# Patient Record
Sex: Female | Born: 1990 | Hispanic: No | Marital: Married | Smoking: Current every day smoker
Health system: Southern US, Community
[De-identification: ages and names within clinical notes are randomized; demographics above are authoritative.]

## PROBLEM LIST (undated history)

## (undated) DIAGNOSIS — Z789 Other specified health status: Secondary | ICD-10-CM

## (undated) DIAGNOSIS — Z720 Tobacco use: Secondary | ICD-10-CM

## (undated) DIAGNOSIS — Z8481 Family history of carrier of genetic disease: Secondary | ICD-10-CM

## (undated) HISTORY — PX: NO PAST SURGERIES: SHX2092

## (undated) HISTORY — DX: Family history of carrier of genetic disease: Z84.81

## (undated) HISTORY — DX: Other specified health status: Z78.9

---

## 2012-08-10 ENCOUNTER — Observation Stay: Payer: Self-pay

## 2012-08-10 LAB — PIH PROFILE
Calcium, Total: 9 mg/dL (ref 8.5–10.1)
Co2: 23 mmol/L (ref 21–32)
Creatinine: 0.51 mg/dL — ABNORMAL LOW (ref 0.60–1.30)
EGFR (Non-African Amer.): >60
HCT: 35.9 % (ref 35.0–47.0)
HGB: 12.6 g/dL (ref 12.0–16.0)
MCHC: 35.1 g/dL (ref 32.0–36.0)
Osmolality: 269 (ref 275–301)
Platelet: 298 10*3/uL (ref 150–440)
Potassium: 3.9 mmol/L (ref 3.5–5.1)
RBC: 4.33 10*6/uL (ref 3.80–5.20)
SGOT(AST): 22 U/L (ref 15–37)
Sodium: 136 mmol/L (ref 136–145)
Uric Acid: 4.2 mg/dL (ref 2.6–6.0)
WBC: 17.2 10*3/uL — ABNORMAL HIGH (ref 3.6–11.0)

## 2012-08-10 LAB — PROTEIN / CREATININE RATIO, URINE

## 2012-08-29 ENCOUNTER — Inpatient Hospital Stay: Payer: Self-pay

## 2012-08-29 LAB — DRUG SCREEN, URINE
Barbiturates, Ur Screen: NEGATIVE (ref ?–200)
Benzodiazepine, Ur Scrn: NEGATIVE (ref ?–200)
Cocaine Metabolite,Ur ~~LOC~~: NEGATIVE (ref ?–300)
Methadone, Ur Screen: NEGATIVE (ref ?–300)
Opiate, Ur Screen: NEGATIVE (ref ?–300)
Phencyclidine (PCP) Ur S: NEGATIVE (ref ?–25)
Tricyclic, Ur Screen: NEGATIVE (ref ?–1000)

## 2012-08-29 LAB — CBC WITH DIFFERENTIAL/PLATELET
Basophil %: 0.5 %
HCT: 39.2 % (ref 35.0–47.0)
HGB: 13.6 g/dL (ref 12.0–16.0)
Lymphocyte %: 17.1 %
MCH: 29.3 pg (ref 26.0–34.0)
MCV: 85 fL (ref 80–100)
Platelet: 311 10*3/uL (ref 150–440)
RBC: 4.64 10*6/uL (ref 3.80–5.20)
RDW: 14.3 % (ref 11.5–14.5)

## 2012-08-29 LAB — GC/CHLAMYDIA PROBE AMP

## 2012-08-30 LAB — HEMATOCRIT: HCT: 34 % — ABNORMAL LOW (ref 35.0–47.0)

## 2013-04-22 ENCOUNTER — Emergency Department: Payer: Self-pay | Admitting: Emergency Medicine

## 2014-01-08 ENCOUNTER — Emergency Department: Payer: Self-pay | Admitting: Student

## 2014-01-08 LAB — CBC
HCT: 41.1 % (ref 35.0–47.0)
HGB: 13.1 g/dL (ref 12.0–16.0)
MCH: 28.1 pg (ref 26.0–34.0)
MCHC: 31.8 g/dL — ABNORMAL LOW (ref 32.0–36.0)
MCV: 89 fL (ref 80–100)
Platelet: 262 10*3/uL (ref 150–440)
RBC: 4.65 10*6/uL (ref 3.80–5.20)
RDW: 14.1 % (ref 11.5–14.5)
WBC: 15.3 10*3/uL — ABNORMAL HIGH (ref 3.6–11.0)

## 2014-01-08 LAB — COMPREHENSIVE METABOLIC PANEL
ALBUMIN: 3.3 g/dL — AB (ref 3.4–5.0)
Alkaline Phosphatase: 62 U/L
Anion Gap: 9 (ref 7–16)
BILIRUBIN TOTAL: 0.2 mg/dL (ref 0.2–1.0)
BUN: 7 mg/dL (ref 7–18)
CALCIUM: 8.6 mg/dL (ref 8.5–10.1)
Chloride: 105 mmol/L (ref 98–107)
Co2: 25 mmol/L (ref 21–32)
Creatinine: 0.46 mg/dL — ABNORMAL LOW (ref 0.60–1.30)
EGFR (African American): >60
EGFR (Non-African Amer.): >60
Glucose: 111 mg/dL — ABNORMAL HIGH (ref 65–99)
OSMOLALITY: 276 (ref 275–301)
POTASSIUM: 3.8 mmol/L (ref 3.5–5.1)
SGOT(AST): 13 U/L — ABNORMAL LOW (ref 15–37)
SGPT (ALT): 11 U/L — ABNORMAL LOW
SODIUM: 139 mmol/L (ref 136–145)
Total Protein: 7.3 g/dL (ref 6.4–8.2)

## 2014-01-08 LAB — URINALYSIS, COMPLETE
Bacteria: NONE SEEN
Bilirubin,UR: NEGATIVE
Blood: NEGATIVE
Glucose,UR: NEGATIVE mg/dL (ref 0–75)
Ketone: NEGATIVE
LEUKOCYTE ESTERASE: NEGATIVE
Nitrite: NEGATIVE
PH: 5 (ref 4.5–8.0)
PROTEIN: NEGATIVE
RBC,UR: 1 /HPF (ref 0–5)
Specific Gravity: 1.032 (ref 1.003–1.030)
Squamous Epithelial: 4

## 2014-01-08 LAB — WET PREP, GENITAL

## 2014-01-08 LAB — HCG, QUANTITATIVE, PREGNANCY: BETA HCG, QUANT.: 20845 m[IU]/mL — AB

## 2014-01-09 LAB — GC/CHLAMYDIA PROBE AMP

## 2014-06-24 ENCOUNTER — Observation Stay
Admit: 2014-06-24 | Disposition: A | Discharge status: Home / Self Care | Payer: Self-pay | Attending: Obstetrics and Gynecology | Admitting: Obstetrics and Gynecology

## 2014-06-24 ENCOUNTER — Inpatient Hospital Stay
Admit: 2014-06-24 | Disposition: A | Discharge status: Home / Self Care | Payer: Self-pay | Attending: Advanced Practice Midwife | Admitting: Advanced Practice Midwife

## 2014-06-24 LAB — URINALYSIS, COMPLETE
Bacteria: NONE SEEN
Bilirubin,UR: NEGATIVE
Blood: NEGATIVE
Glucose,UR: NEGATIVE mg/dL (ref 0–75)
Ketone: NEGATIVE
NITRITE: NEGATIVE
Ph: 6 (ref 4.5–8.0)
Protein: NEGATIVE
RBC,UR: 2 /HPF (ref 0–5)
SPECIFIC GRAVITY: 1.032 (ref 1.003–1.030)
Squamous Epithelial: 4

## 2014-06-24 LAB — CBC WITH DIFFERENTIAL/PLATELET
BASOS ABS: 0.1 10*3/uL (ref 0.0–0.1)
BASOS ABS: 0.1 10*3/uL (ref 0.0–0.1)
BASOS PCT: 0.4 %
Basophil %: 0.4 %
EOS ABS: 0.2 10*3/uL (ref 0.0–0.7)
EOS PCT: 1.1 %
Eosinophil #: 0.1 10*3/uL (ref 0.0–0.7)
Eosinophil %: 0.3 %
HCT: 37.7 % (ref 35.0–47.0)
HCT: 40.4 % (ref 35.0–47.0)
HGB: 12.6 g/dL (ref 12.0–16.0)
HGB: 13.2 g/dL (ref 12.0–16.0)
LYMPHS ABS: 2.3 10*3/uL (ref 1.0–3.6)
LYMPHS PCT: 23.3 %
Lymphocyte #: 4 10*3/uL — ABNORMAL HIGH (ref 1.0–3.6)
Lymphocyte %: 11.3 %
MCH: 28.9 pg (ref 26.0–34.0)
MCH: 28.5 pg (ref 26.0–34.0)
MCHC: 33.4 g/dL (ref 32.0–36.0)
MCHC: 32.7 g/dL (ref 32.0–36.0)
MCV: 87 fL (ref 80–100)
MCV: 87 fL (ref 80–100)
MONOS PCT: 6.2 %
Monocyte #: 1.1 x10 3/mm — ABNORMAL HIGH (ref 0.2–0.9)
Monocyte #: 0.7 x10 3/mm (ref 0.2–0.9)
Monocyte %: 3.4 %
NEUTROS ABS: 17.5 10*3/uL — AB (ref 1.4–6.5)
Neutrophil #: 12 10*3/uL — ABNORMAL HIGH (ref 1.4–6.5)
Neutrophil %: 69 %
Neutrophil %: 84.6 %
Platelet: 244 10*3/uL (ref 150–440)
Platelet: 261 10*3/uL (ref 150–440)
RBC: 4.35 10*6/uL (ref 3.80–5.20)
RBC: 4.65 10*6/uL (ref 3.80–5.20)
RDW: 14 % (ref 11.5–14.5)
RDW: 13.6 % (ref 11.5–14.5)
WBC: 17.4 10*3/uL — AB (ref 3.6–11.0)
WBC: 20.7 10*3/uL — ABNORMAL HIGH (ref 3.6–11.0)

## 2014-06-24 LAB — DRUG SCREEN, URINE
AMPHETAMINES, UR SCREEN: NEGATIVE
AMPHETAMINES, UR SCREEN: NEGATIVE
BARBITURATES, UR SCREEN: NEGATIVE
BENZODIAZEPINE, UR SCRN: NEGATIVE
Barbiturates, Ur Screen: NEGATIVE
Benzodiazepine, Ur Scrn: NEGATIVE
COCAINE METABOLITE, UR ~~LOC~~: NEGATIVE
Cannabinoid 50 Ng, Ur ~~LOC~~: POSITIVE
Cannabinoid 50 Ng, Ur ~~LOC~~: POSITIVE
Cocaine Metabolite,Ur ~~LOC~~: POSITIVE
MDMA (Ecstasy)Ur Screen: NEGATIVE
MDMA (Ecstasy)Ur Screen: NEGATIVE
METHADONE, UR SCREEN: NEGATIVE
Methadone, Ur Screen: NEGATIVE
OPIATE, UR SCREEN: POSITIVE
Opiate, Ur Screen: NEGATIVE
Phencyclidine (PCP) Ur S: NEGATIVE
Phencyclidine (PCP) Ur S: NEGATIVE
TRICYCLIC, UR SCREEN: POSITIVE
TRICYCLIC, UR SCREEN: NEGATIVE

## 2014-06-24 LAB — GLUCOSE, RANDOM: GLUCOSE: 104 mg/dL — AB

## 2014-06-24 LAB — GC/CHLAMYDIA PROBE AMP

## 2014-06-24 LAB — RAPID HIV SCREEN (HIV 1/2 AB+AG)

## 2014-06-25 LAB — HEMATOCRIT: HCT: 36.1 % (ref 35.0–47.0)

## 2014-07-17 LAB — SURGICAL PATHOLOGY

## 2014-08-01 NOTE — H&P (Signed)
L&D Evaluation:  History Expanded:  HPI 24 year old G2P1001 @ 117w0d by EDC of 07/01/2014 per LMP & 15 wk US presents with c/o contractions. [facility] with late entry to care and limited care (only 2 visits at 27 weeks and 28 weeks), incomplete anatomy scan (views of ductal arch not obtained), and canabis positive UDS.  Pt was evaluated early this am for the same. UDS at that time was + for Cocaine, Opiates, TCA and canabis. Dr Bonney AidStaebler offered IOL which pt declined and went home AMA. PN Labs drawn this am.   Blood Type (Maternal) O positive, per prior records   Group B Strep Results Maternal (Result >5wks must be treated as unknown) unknown/result > 5 weeks ago  drawn this am   Maternal HIV Negative   Maternal Syphilis Ab Unknown   Maternal Varicella Unknown   Rubella Results (Maternal) unknown   Maternal T-Dap Unknown   Presents with contractions   Patient's Medical History No Chronic Illness  h/o HTN per prior records   Patient's Surgical History none   Allergies NKDA   Social History drugs   Family History Non-Contributory   ROS:  ROS All systems were reviewed.  HEENT, CNS, GI, GU, Respiratory, CV, Renal and Musculoskeletal systems were found to be normal.   Exam:  Vital Signs stable   General no apparent distress   Mental Status clear   Chest clear   Heart normal sinus rhythm   Abdomen gravid, non-tender   Estimated Fetal Weight Average for gestational age   Fetal Position vtx   Back no CVAT   Edema no edema   Pelvic 7/100/0   FHT 140, moderate, positive accels, no decels   Ucx regular   Impression:  Impression active labor   Plan:  Comments Admit for labor Repeat UDS and remainder of PN Labs   Electronic Signatures: Vella KohlerBrothers, Shaneka Efaw K (CNM)  (Signed 02-Apr-16 23:05)  Authored: L&D Evaluation   Last Updated: 02-Apr-16 23:05 by Vella KohlerBrothers, Girtha Kilgore K (CNM)

## 2014-08-01 NOTE — H&P (Signed)
L&D Evaluation:  History:  HPI 24 year old G2P1001 @ 6458w0d [facility] with late entry to care and limited care (only 2 visits at 27 weeks and 28 weeks), incomplete anatomy scan (views of ductal arch not obtained), and canabis positive UDS.  GBS unknown.   Presents with contractions   Patient's Medical History No Chronic Illness   Patient's Surgical History none   Medications Pre Natal Vitamins   Allergies NKDA   Social History drugs   Family History Non-Contributory   ROS:  ROS All systems were reviewed.  HEENT, CNS, GI, GU, Respiratory, CV, Renal and Musculoskeletal systems were found to be normal.   Exam:  Vital Signs stable  130/78   General no apparent distress   Mental Status clear    Chest clear    Heart normal sinus rhythm   Abdomen gravid, non-tender   Estimated Fetal Weight Average for gestational age   Fetal Position vtx   Back no CVAT   Edema no edema    Pelvic 3/70/-2   FHT 140, moderate, positive accels, no decels   Ucx absent   Impression:  Impression 5158w0d by D=30wk US derived EDC presenting for r/o labor   Plan:  Comments 1) CTX - very inconsistent contractions, no longer feels after arriving  2) Fetus - category I tracing  3) No prenatal - care UDS, all prenatal labs, and GBS collected today  4) Disposition - likley home after recheck needs follow up in the next week.   Electronic Signatures: Lorrene ReidStaebler, Eliabeth Shoff M (MD)  (Signed 02-Apr-16 04:57)  Authored: L&D Evaluation   Last Updated: 02-Apr-16 04:57 by Lorrene ReidStaebler, Quasean Frye M (MD)

## 2014-08-01 NOTE — H&P (Signed)
L&D Evaluation:  History Expanded:  HPI 24yo G1P0 at 2438 6/[redacted] weeks EGA w contractions this am, no rom or vb.  Prenatal Care at [facility].  O+, VI, RI, GBS+.  h/o tob and MJ.   Gravida 1   Term 0   Blood Type (Maternal) O positive   Group B Strep Results Maternal (Result >5wks must be treated as unknown) positive   Maternal HIV Negative   Maternal Syphilis Ab Nonreactive   Maternal Varicella Immune   Rubella Results (Maternal) immune   Maternal [facility]Dap [facility]   Presents with contractions   Patient's Medical History No Chronic Illness   Patient's Surgical History none   Medications Pre Natal Vitamins   Allergies NKDA   Social History none   Family History Non-Contributory   ROS:  ROS All systems were reviewed.  HEENT, CNS, GI, GU, Respiratory, CV, Renal and Musculoskeletal systems were found to be normal.   Exam:  Vital Signs stable   General no apparent distress   Mental Status clear   Chest clear   Heart normal sinus rhythm, no murmur/gallop/rubs   Abdomen gravid, tender with contractions   Estimated Fetal Weight Average for gestational age   Back no CVAT   Edema no edema   Reflexes 1+   Pelvic no external lesions, 3cm   Mebranes Intact   FHT normal rate with no decels   Ucx regular   Impression:  Impression early labor   Plan:  Plan EFM/NST, monitor contractions and for cervical change, antibiotics for GBBS prophylaxis, fluids   Electronic Signatures: Letitia LibraHarris, Yossi Hinchman Paul (MD)  (Signed 08-Jun-14 11:32)  Authored: L&D Evaluation   Last Updated: 08-Jun-14 11:32 by Letitia LibraHarris, Tyerra Loretto Paul (MD)

## 2014-08-01 NOTE — H&P (Signed)
L&D Evaluation:  History:  HPI 24 year old G1 P0 with EDC=09/06/2012 by a 8wk1d ultrasound presented at 4236 1/7 weeks with increasing edema x 3-4 days and concerns regarding preeclampsia.She has had increasing heartburn and nausea over the weekend and vomited twice. Has had some headaches that have resolved with rest. She has had difficulty feeling the baby move during the day...usually moves better at 2330 each evening. She has had some contractions, lower abdominal cramping, and pink mucoid discharge x1. [facility] her prenatal care at [facility] and [facility].  PNC remarkable for positive UDS for MJ and opiates (took pain meds for tooth pain)-was referred to [facility]-but has not followed thru with appt. Hx of CHTN in the past and was on Lisinopril in 2011?Marland Kitchen.  Blood pressures in first trimester 128/76, 120/63. Had right fetal pelviectasis on anatomy scan that resolved at 24 weeks. LABS: O POS, Rubella UTD, VI. +UDS for oxycodone and MJ 05/27/2012   Presents with edema, N/V, contractions   Patient's Medical History HTN (on meds x 3 mos)   Patient's Surgical History none   Medications Pre Natal Vitamins   Allergies NKDA   Social History drugs  MJ   Family History Non-Contributory   ROS:  ROS see HPI   Exam:  Vital Signs 127/75, 121/71, 126/67, 128/74   Urine Protein negative dipstick, P/C ratio-too low to calculate   General no apparent distress   Mental Status clear   Chest clear   Heart normal sinus rhythm, no murmur/gallop/rubs   Abdomen gravid, non-tender, mild tenderness in LUS with Leopolds   Estimated Fetal Weight Average for gestational age   Fetal Position cephalic   Edema 1+  some facial and hand edema   Reflexes 3+   Pelvic no external lesions, 1/70%/-2 to -3   Mebranes Intact   FHT normal rate with no decels, 135-140 baseline with accels to 160s, mod variability   FHT Description CAT1   Ucx irregular, q2-11 min apart, mild   Skin dry    Other LABS: 12.6/35.9%, plt=298, uric acid 4.2, SGOT=22, BUN=5, cr=.51   Impression:  Impression IUP at 36 1/7 weeks with no evidence of preeclampsia. Reactive NST   Plan:  Plan Discharge home with preeclampsia and labor precautions. ROB appt in 4 days.  Long discussion about S&S of preeclampsia as well as sx of labor.   Electronic Signatures: Trinna BalloonGutierrez, Meade Hogeland L (CNM)  (Signed 20-May-14 03:30)  Authored: L&D Evaluation   Last Updated: 20-May-14 03:30 by Trinna BalloonGutierrez, Gerlean Cid L (CNM)

## 2016-07-27 IMAGING — US US OB LIMITED
1 series · 14 of 28 positions shown · non-contrast
Comparison: none

CLINICAL DATA: Abdominal cramping for the past week. Positive
pregnancy test. Fourteen weeks and 5 days pregnant by last menstrual
period.

EXAM:
LIMITED OBSTETRIC ULTRASOUND

[Series 1: us ob limited · 0.22mm/px · 14 of 33 slices shown]
[im 2/33]
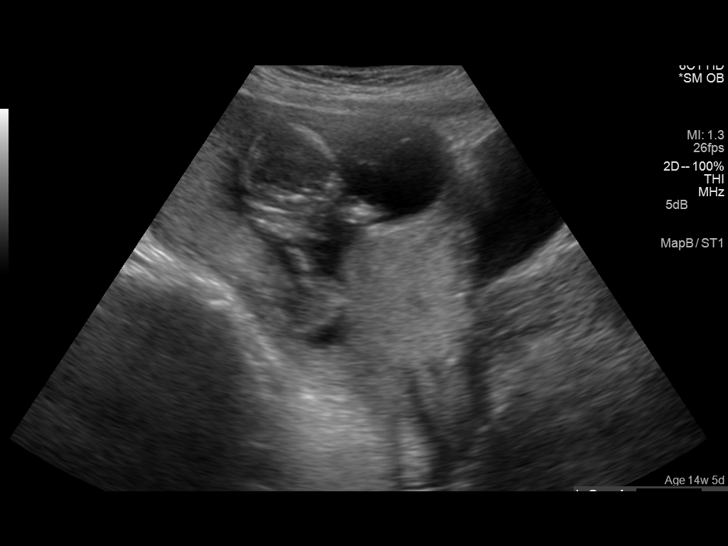
[im 4/33]
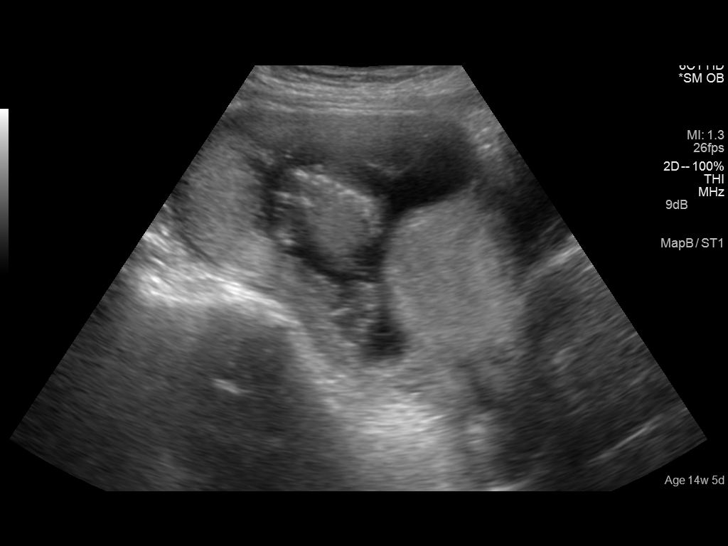
[im 6/33]
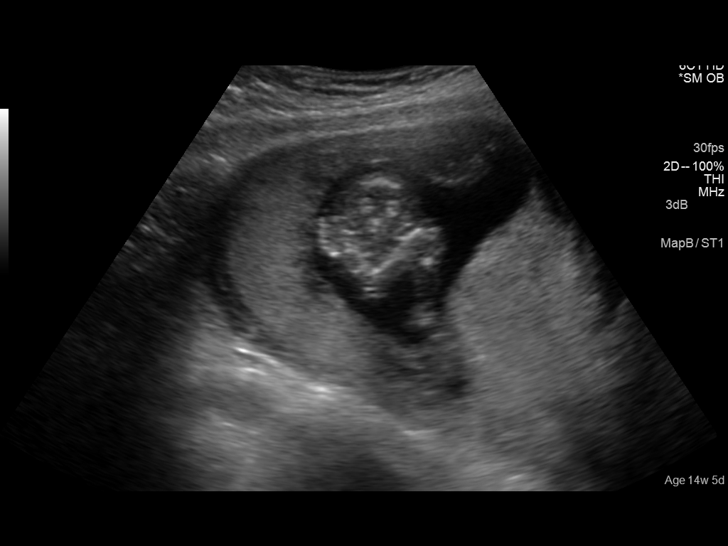
[im 9/33]
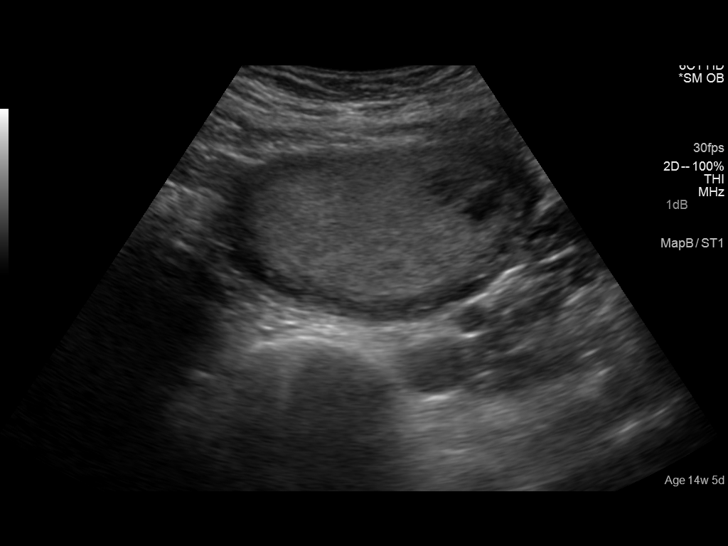
[im 11/33]
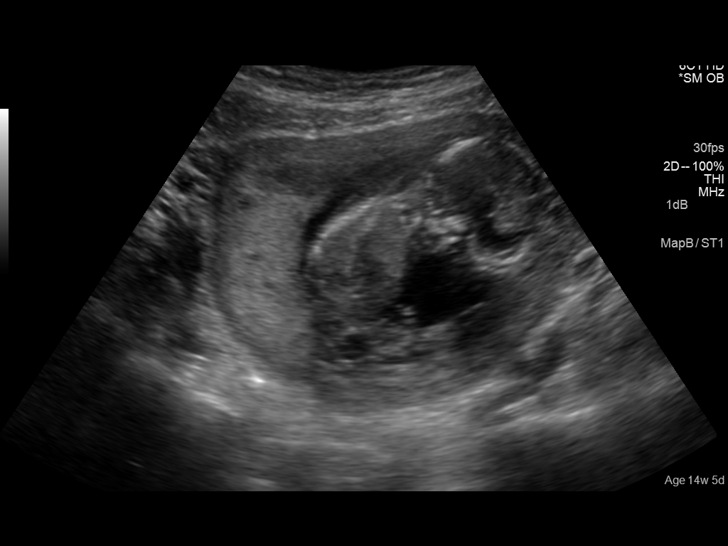
[im 14/33]
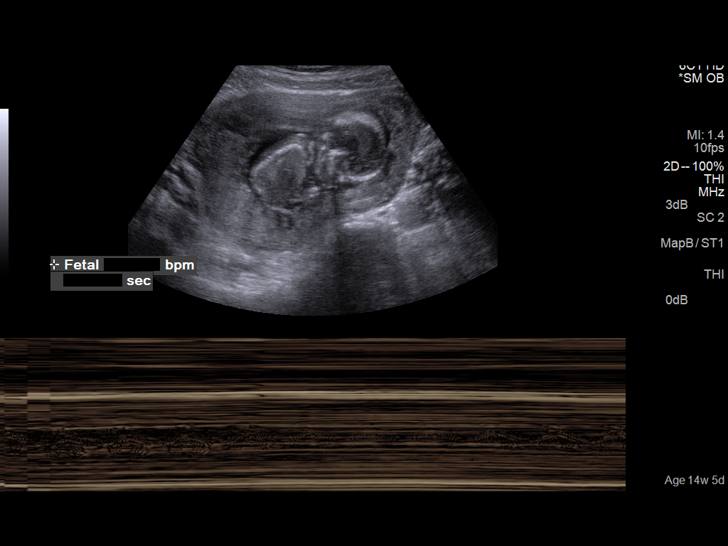
[im 16/33]
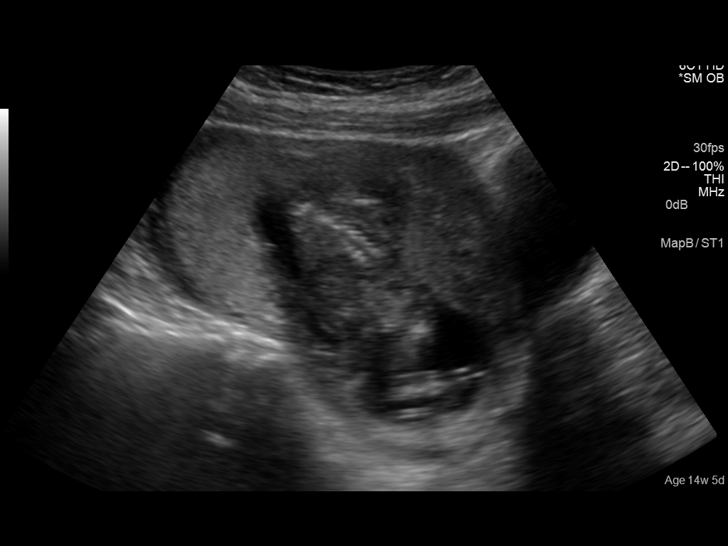
[im 18/33]
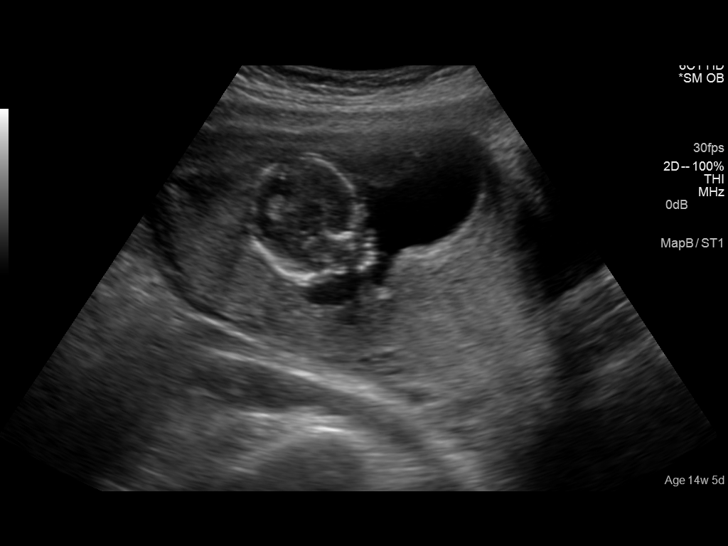
[im 21/33]
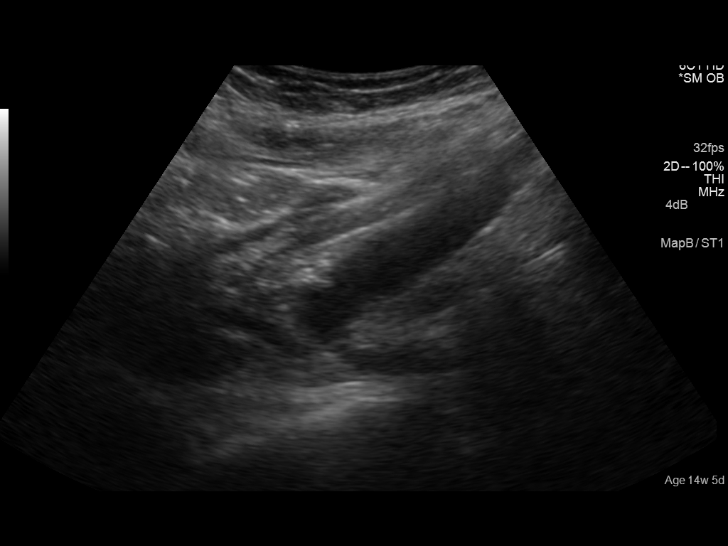
[im 23/33]
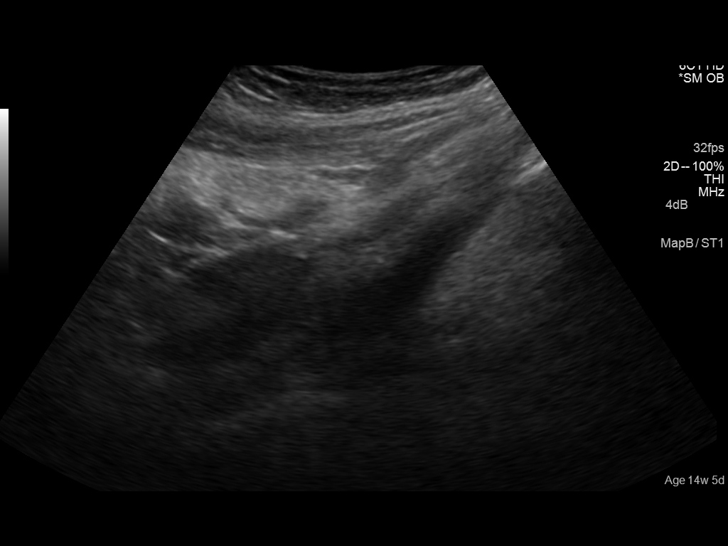
[im 25/33]
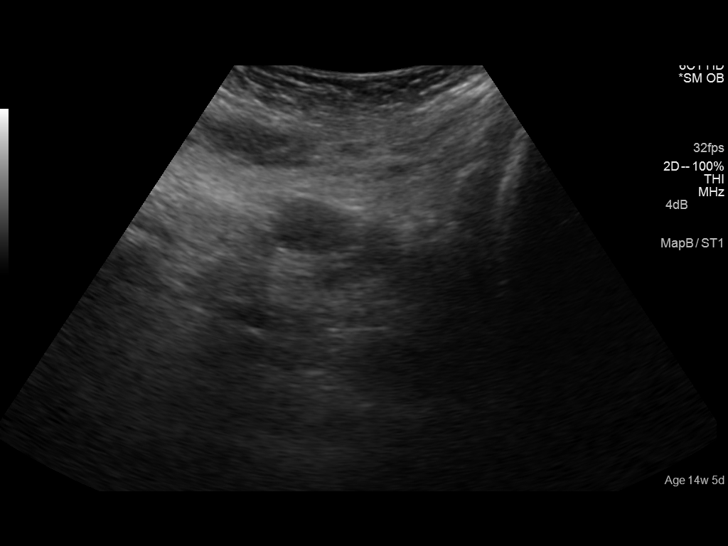
[im 28/33]
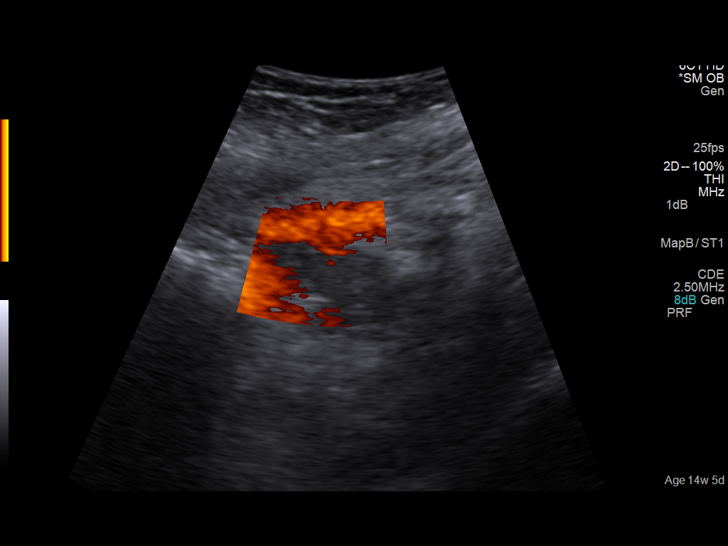
[im 30/33]
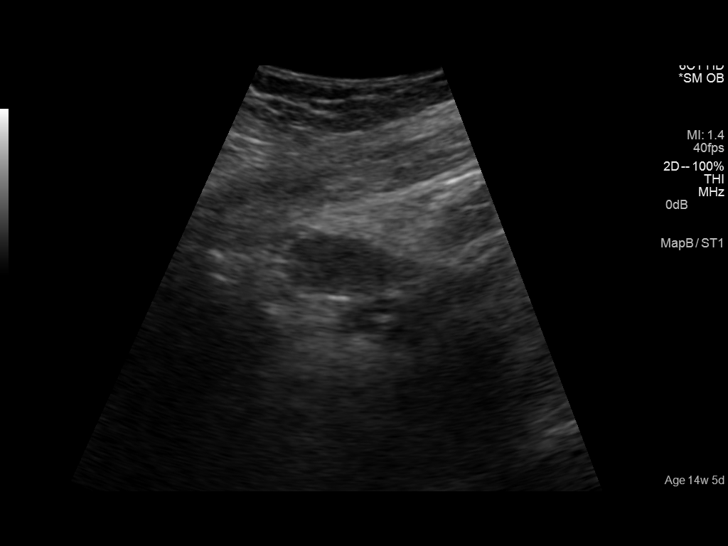
[im 33/33]
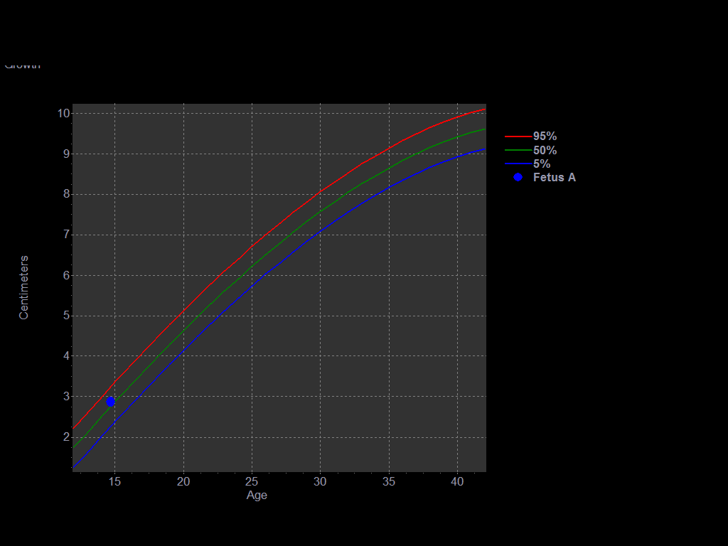

[14 of 28 positions shown; findings below may reference images not displayed]

FINDINGS: Number of Fetuses: 1

Heart Rate:  157 bpm

Movement: Yes

Presentation: Breech

Placental Location: Right lateral

Previa: No

Amniotic Fluid (Subjective):  Within normal limits.

BPD:  2.9cm 15w  1d

MATERNAL FINDINGS:

Cervix:  Appears closed.

Uterus/Adnexae:  No abnormality visualized.
IMPRESSION: Single live intrauterine gestation in a breech presentation with an
estimated gestational age of 15 weeks 1 day. No complicating
features.

This exam is performed on an emergent basis and does not
comprehensively evaluate fetal size, dating, or anatomy; follow-up
complete OB US should be considered if further fetal assessment is
warranted.

## 2018-03-24 NOTE — L&D Delivery Note (Signed)
Delivery Note Primary OB: [facility] Delivery Provider: Avel Sensor, CNM Gestational Age: Full term Antepartum complications: none Intrapartum complications: meconium stained amniotic fluid  A viable female was delivered via vertex presentation, LOA at 1144 am.  No nuchal cord was present. Delivery of the shoulders and body followed without difficulty. The infant was placed on the maternal abdomen. The umbilical cord was doubly clamped and cut following delayed cord clamping. Cord blood was collected. The placenta was delivered spontaneously and was inspected and found to be intact with a three vessel cord. The cervix and vagina were inspected. There was a right labial abrasion, hemostatic, which did not require repair. There were no lacerations.  The fundus was firm. Patient and infant were bonding in stable condition. All counts were correct.  Apgars: 9, 9  Weight: pending  Anesthesia:  none Episiotomy:  none Lacerations:  labial abrasion, right Suture Repair: none Est. Blood Loss (mL):  200 mL  Mom to postpartum.  Baby to Couplet care / Skin to Skin.  Avel Sensor, CNM [facility], [facility] 10/07/2018  12:07 PM

## 2018-05-07 ENCOUNTER — Other Ambulatory Visit (HOSPITAL_COMMUNITY)
Admission: RE | Admit: 2018-05-07 | Discharge: 2018-05-07 | Disposition: A | Discharge status: Home / Self Care | Payer: Medicaid Other | Source: Ambulatory Visit | Attending: Obstetrics and Gynecology | Admitting: Obstetrics and Gynecology

## 2018-05-07 ENCOUNTER — Encounter: Payer: Self-pay | Admitting: Obstetrics and Gynecology

## 2018-05-07 ENCOUNTER — Ambulatory Visit (INDEPENDENT_AMBULATORY_CARE_PROVIDER_SITE_OTHER): Payer: Medicaid Other | Admitting: Obstetrics and Gynecology

## 2018-05-07 VITALS — BP 112/68 | HR 80 | Wt 190.0 lb

## 2018-05-07 DIAGNOSIS — O0930 Supervision of pregnancy with insufficient antenatal care, unspecified trimester: Secondary | ICD-10-CM | POA: Insufficient documentation

## 2018-05-07 DIAGNOSIS — Z3689 Encounter for other specified antenatal screening: Secondary | ICD-10-CM

## 2018-05-07 DIAGNOSIS — Z23 Encounter for immunization: Secondary | ICD-10-CM

## 2018-05-07 DIAGNOSIS — Z124 Encounter for screening for malignant neoplasm of cervix: Secondary | ICD-10-CM | POA: Insufficient documentation

## 2018-05-07 DIAGNOSIS — Z3A17 17 weeks gestation of pregnancy: Secondary | ICD-10-CM

## 2018-05-07 DIAGNOSIS — Z363 Encounter for antenatal screening for malformations: Secondary | ICD-10-CM

## 2018-05-07 DIAGNOSIS — O99320 Drug use complicating pregnancy, unspecified trimester: Secondary | ICD-10-CM

## 2018-05-07 DIAGNOSIS — Z113 Encounter for screening for infections with a predominantly sexual mode of transmission: Secondary | ICD-10-CM | POA: Diagnosis present

## 2018-05-07 DIAGNOSIS — Z3201 Encounter for pregnancy test, result positive: Secondary | ICD-10-CM

## 2018-05-07 DIAGNOSIS — O0932 Supervision of pregnancy with insufficient antenatal care, second trimester: Secondary | ICD-10-CM

## 2018-05-07 DIAGNOSIS — N912 Amenorrhea, unspecified: Secondary | ICD-10-CM

## 2018-05-07 DIAGNOSIS — Z3480 Encounter for supervision of other normal pregnancy, unspecified trimester: Secondary | ICD-10-CM | POA: Diagnosis present

## 2018-05-07 LAB — POCT URINALYSIS DIPSTICK OB
Glucose, UA: NEGATIVE
PROTEIN: NEGATIVE

## 2018-05-07 LAB — OB RESULTS CONSOLE VARICELLA ZOSTER ANTIBODY, IGG: Varicella: IMMUNE

## 2018-05-07 LAB — POCT URINE PREGNANCY: PREG TEST UR: POSITIVE — AB

## 2018-05-07 LAB — OB RESULTS CONSOLE GC/CHLAMYDIA: Gonorrhea: NEGATIVE

## 2018-05-07 NOTE — Progress Notes (Signed)
NOB Late entry to care Unknown LMP

## 2018-05-07 NOTE — Progress Notes (Signed)
New Obstetric Patient H&P    Chief Complaint: "Desires prenatal care"   History of Present Illness: Patient is a 28 y.o. [id number] Not Hispanic or Latino female, presents with amenorrhea and positive home pregnancy test. Patient's last menstrual period was 01/07/2018 (lmp unknown). and based on her  LMP, her EDD is Estimated Date of Delivery: 10/14/18 and her EGA is [redacted]w[redacted]d. LMP is approximate.   She had a urine pregnancy test which was positive 1 month(s)  ago. Her last menstrual period was normal. Since her LMP she claims she has experienced fatigue, breast tenderness, no significant nausea. She denies vaginal bleeding. Her past medical history is noncontributory. Her prior pregnancies are notable for none  Since her LMP, she admits to the use of tobacco products  no There are cats in the home in the home  no She admits close contact with children on a regular basis  yes  She has had chicken pox in the past yes She has had Tuberculosis exposures, symptoms, or previously tested positive for TB   no Current or past history of domestic violence. no  Genetic Screening/Teratology Counseling: (Includes patient, baby's father, or anyone in either family with:)   1. Patient's age >/= 36 [facility]  no 2. Thalassemia ([place], [place], Mediterranean, or Asian background): MCV<80  no 3. Neural tube defect (meningomyelocele, spina bifida, anencephaly)  no 4. Congenital heart defect  no  5. Down syndrome  no 6. Tay-Sachs (Jewish, [place]  no 7. Canavan's Disease  no 8. Sickle cell disease or trait (African)  yes (FOB sickle cell trait) 9. Hemophilia or other blood disorders  no  10. Muscular dystrophy  no  11. Cystic fibrosis  no 12. Huntington's Chorea  no  13. Mental retardation/autism  no 14. Other inherited genetic or chromosomal disorder  no 15. Maternal metabolic disorder (DM, PKU, etc)  no 16. Patient or FOB with a child with a birth defect not listed above no  16a. Patient or FOB  with a birth defect themselves no 17. Recurrent pregnancy loss, or stillbirth  no  18. Any medications since LMP other than prenatal vitamins (include vitamins, supplements, OTC meds, drugs, alcohol)  no 19. Any other genetic/environmental exposure to discuss  no  Infection History:   1. Lives with someone with TB or TB exposed  no  2. Patient or partner has history of genital herpes  no 3. Rash or viral illness since LMP  no 4. History of STI (GC, CT, HPV, syphilis, HIV)  no 5. History of recent travel :  no  Other pertinent information:  yes FOB with sickle cell trait    Review of Systems:10 point review of systems negative unless otherwise noted in HPI  Past Medical History:  Past Medical History:  Diagnosis Date  . No known health problems     Past Surgical History:  Past Surgical History:  Procedure Laterality Date  . NO PAST SURGERIES      Gynecologic History: Patient's last menstrual period was 01/07/2018 (lmp unknown).  Obstetric History: [id number]  Family History:  Family History  Problem Relation Age of Onset  . Breast cancer Maternal Grandmother 14    Social History:  Social History   Socioeconomic History  . Marital status: Married    Spouse name: Not on file  . Number of children: Not on file  . Years of education: Not on file  . Highest education level: Not on file  Occupational History  . Occupation: Unemployeed  Social Needs  . Financial resource strain: Not on file  . Food insecurity:    Worry: Not on file    Inability: Not on file  . Transportation needs:    Medical: Not on file    Non-medical: Not on file  Tobacco Use  . Smoking status: Current Every Day Smoker  . Smokeless tobacco: Never Used  Substance and Sexual Activity  . Alcohol use: Not Currently  . Drug use: Yes    Types: Marijuana  . Sexual activity: Yes    Birth control/protection: None  Lifestyle  . Physical activity:    Days per week: Not on file    Minutes per  session: Not on file  . Stress: Not on file  Relationships  . Social connections:    Talks on phone: Not on file    Gets together: Not on file    Attends religious service: Not on file    Active member of club or organization: Not on file    Attends meetings of clubs or organizations: Not on file    Relationship status: Not on file  . Intimate partner violence:    Fear of current or ex partner: Not on file    Emotionally abused: Not on file    Physically abused: Not on file    Forced sexual activity: Not on file  Other Topics Concern  . Not on file  Social History Narrative  . Not on file    Allergies:  No Known Allergies  Medications: Prior to Admission medications   Not on File    Physical Exam Vitals: Blood pressure 112/68, pulse 80, weight 190 lb (86.2 kg), last menstrual period 01/07/2018.  General: NAD HEENT: normocephalic, anicteric Thyroid: no enlargement, no palpable nodules Pulmonary: No increased work of breathing, CTAB Cardiovascular: RRR, distal pulses 2+ Abdomen: NABS, soft, non-tender, non-distended.  Umbilicus without lesions.  No hepatomegaly, splenomegaly or masses palpable. No evidence of hernia  Genitourinary:  External: Normal external female genitalia.  Normal urethral meatus, normal  Bartholin's and Skene's glands.    Vagina: Normal vaginal mucosa, no evidence of prolapse.    Cervix: Grossly normal in appearance, no bleeding  Uterus: Enlarged, mobile, normal contour.  No CMT  Adnexa: ovaries non-enlarged, no adnexal masses  Rectal: deferred Extremities: no edema, erythema, or tenderness Neurologic: Grossly intact Psychiatric: mood appropriate, affect full   Assessment: 28 y.o. G3P2002 at [redacted]w[redacted]d presenting to initiate prenatal care  Plan: 1) Avoid alcoholic beverages. 2) Patient encouraged not to smoke.  3) Discontinue the use of all non-medicinal drugs and chemicals.  4) Take prenatal vitamins daily.  5) Nutrition, food safety (fish,  cheese advisories, and high nitrite foods) and exercise discussed. 6) Hospital and practice style discussed with cross coverage system.  7) Genetic Screening, such as with 1st Trimester Screening, cell free fetal DNA, AFP testing, and Ultrasound, as well as with amniocentesis and CVS as appropriate, is discussed with patient. At the conclusion of today's visit patient requested genetic testing 8) Patient is asked about travel to areas at risk for the Zika virus, and counseled to avoid travel and exposure to mosquitoes or sexual partners who may have themselves been exposed to the virus. Testing is discussed, and will be ordered as appropriate.  9) Return in about 1 week (around 05/14/2018) for ROB/anatomy/dating scan.  Vena [place], MD, [facility], [facility] 05/07/2018, 9:34 AM

## 2018-05-09 LAB — URINE CULTURE

## 2018-05-09 LAB — RPR+RH+ABO+RUB AB+AB SCR+CB...
Antibody Screen: NEGATIVE
HEMATOCRIT: 41.7 % (ref 34.0–46.6)
HEP B S AG: NEGATIVE
HIV Screen 4th Generation wRfx: NONREACTIVE
Hemoglobin: 14.1 g/dL (ref 11.1–15.9)
MCH: 29.3 pg (ref 26.6–33.0)
MCHC: 33.8 g/dL (ref 31.5–35.7)
MCV: 87 fL (ref 79–97)
Platelets: 239 10*3/uL (ref 150–450)
RBC: 4.82 x10E6/uL (ref 3.77–5.28)
RDW: 13.2 % (ref 11.7–15.4)
RH TYPE: POSITIVE
RPR: NONREACTIVE
Rubella Antibodies, IGG: 4.02 index (ref >0.99)
Varicella zoster IgG: 2469 index (ref >165)
WBC: 12.4 10*3/uL — ABNORMAL HIGH (ref 3.4–10.8)

## 2018-05-10 LAB — URINE DRUG PANEL 7

## 2018-05-11 LAB — CYTOLOGY - PAP
Chlamydia: NEGATIVE
Diagnosis: NEGATIVE
HPV (WINDOPATH): NOT DETECTED
NEISSERIA GONORRHEA: NEGATIVE

## 2018-05-14 ENCOUNTER — Ambulatory Visit (INDEPENDENT_AMBULATORY_CARE_PROVIDER_SITE_OTHER): Payer: Medicaid Other | Admitting: Maternal Newborn

## 2018-05-14 ENCOUNTER — Ambulatory Visit (INDEPENDENT_AMBULATORY_CARE_PROVIDER_SITE_OTHER): Payer: Medicaid Other

## 2018-05-14 ENCOUNTER — Encounter: Payer: Self-pay | Admitting: Maternal Newborn

## 2018-05-14 VITALS — BP 120/80 | Ht 69.0 in | Wt 196.0 lb

## 2018-05-14 DIAGNOSIS — Z363 Encounter for antenatal screening for malformations: Secondary | ICD-10-CM | POA: Diagnosis not present

## 2018-05-14 DIAGNOSIS — Z3480 Encounter for supervision of other normal pregnancy, unspecified trimester: Secondary | ICD-10-CM

## 2018-05-14 DIAGNOSIS — Z3A18 18 weeks gestation of pregnancy: Secondary | ICD-10-CM

## 2018-05-14 DIAGNOSIS — Z1379 Encounter for other screening for genetic and chromosomal anomalies: Secondary | ICD-10-CM

## 2018-05-14 DIAGNOSIS — Z3689 Encounter for other specified antenatal screening: Secondary | ICD-10-CM

## 2018-05-14 DIAGNOSIS — O0932 Supervision of pregnancy with insufficient antenatal care, second trimester: Secondary | ICD-10-CM

## 2018-05-14 DIAGNOSIS — O26899 Other specified pregnancy related conditions, unspecified trimester: Secondary | ICD-10-CM

## 2018-05-14 DIAGNOSIS — O0930 Supervision of pregnancy with insufficient antenatal care, unspecified trimester: Secondary | ICD-10-CM

## 2018-05-14 DIAGNOSIS — O26892 Other specified pregnancy related conditions, second trimester: Secondary | ICD-10-CM

## 2018-05-14 DIAGNOSIS — Z369 Encounter for antenatal screening, unspecified: Secondary | ICD-10-CM

## 2018-05-14 DIAGNOSIS — R12 Heartburn: Secondary | ICD-10-CM

## 2018-05-14 LAB — POCT URINALYSIS DIPSTICK
Glucose, UA: NEGATIVE
Protein, UA: POSITIVE — AB

## 2018-05-14 MED ORDER — FAMOTIDINE 20 MG PO TABS: 20.0000 mg | ORAL_TABLET | Freq: Two times a day (BID) | ORAL | 3 refills | Status: DC

## 2018-05-14 NOTE — Progress Notes (Signed)
    Routine Prenatal Care Visit  Subjective  Joy Harrell is a 28 y.o. G3P2002 at [redacted]w[redacted]d being seen today for ongoing prenatal care.  She is currently monitored for the following issues for this low-risk pregnancy and has Supervision of other normal pregnancy, antepartum and Late prenatal care on their problem list.  ----------------------------------------------------------------------------------- Patient reports heartburn.    Vag. Bleeding: None.  No leaking of fluid.  ----------------------------------------------------------------------------------- The following portions of the patient's history were reviewed and updated as appropriate: allergies, current medications, past family history, past medical history, past social history, past surgical history and problem list. Problem list updated.  Objective  Blood pressure 120/80, height 5\' 9"  (1.753 m), weight 196 lb (88.9 kg), last menstrual period 01/07/2018.  Pregravid weight 180 lb (81.6 kg) Total Weight Gain 16 lb (7.258 kg) Body mass index is 28.94 kg/m.   Urinalysis: Urine dipstick shows negative for glucose, positive for protein.  Fetal Status: Fetal Heart Rate (bpm): 163         General:  Alert, oriented and cooperative. Patient is in no acute distress.  Skin: Skin is warm and dry. No rash noted.   Cardiovascular: Normal heart rate noted  Respiratory: Normal respiratory effort, no problems with respiration noted  Abdomen: Soft, gravid, appropriate for gestational age. Pain/Pressure: Absent     Pelvic:  Cervical exam deferred        Extremities: Normal range of motion.     Mental Status: Normal mood and affect. Normal behavior. Normal judgment and thought content.    Assessment   28 y.o. [id number] at [redacted]w[redacted]d, EDD 10/14/2018 by Last Menstrual Period presenting for a routine prenatal visit.  Plan   Pregnancy#3 Problems (from 01/07/18 to present)    Problem Noted Resolved   Supervision of other normal pregnancy,  antepartum 05/07/2018 by Vena [place], MD No   Overview Addendum 05/12/2018  8:39 AM by Vena [place], MD    Clinic [facility] Prenatal Labs  Dating  Blood type: O pos  Genetic Screen 1 Screen:    AFP:     Quad:     NIPS: SMA:  FragileX:  CF: Antibody: negative  Anatomic [place]  Rubella: Immune Varicella: Immune  GTT  RPR: NR  Rhogam N/A HBsAg: negative  TDaP vaccine                       Flu Shot: 05/07/2018 HIV: negative  Baby Food                                GBS:   Contraception  GBS prior pregnancy: YES  Pelvis Tested  7lbs Pap: 05/07/2018 NIL HPV negative  CS/VBAC N/A   Support Person Radio producer and dating scan. Dating confirmed same as LMP. Breech presentation. Incomplete for cardiac views, face and diaphragm. Bilateral kidney pelviectasis. Possible fluid noted in cervix; patient reports no bleeding or fluid loss. Follow up ultrasound next visit. Findings and plan discussed with patient.  Labs today for genetic screening and Hgb electrophoresis.  Rx sent for Pepcid.  Please refer to After Visit Summary for other counseling recommendations.   Return in about 4 weeks (around 06/11/2018) for ROB and follow up anatomy.  Marcelyn Bruins, CNM 05/14/2018

## 2018-05-14 NOTE — Patient Instructions (Signed)

## 2018-05-18 LAB — HEMOGLOBINOPATHY EVALUATION
HEMOGLOBIN A2 QUANTITATION: 2.5 % (ref 1.8–3.2)
HGB C: 0 %
HGB S: 0 %
HGB VARIANT: 0 %
Hemoglobin F Quantitation: 0 % (ref 0.0–2.0)
Hgb A: 97.5 % (ref 96.4–98.8)

## 2018-05-20 LAB — MATERNIT 21 PLUS CORE, BLOOD
CHROMOSOME 13: NEGATIVE
CHROMOSOME 21: NEGATIVE
Chromosome 18: NEGATIVE
Fetal Fraction: 11
Y Chromosome: DETECTED

## 2018-05-25 ENCOUNTER — Other Ambulatory Visit: Payer: Self-pay | Admitting: Maternal Newborn

## 2018-05-25 DIAGNOSIS — Z3689 Encounter for other specified antenatal screening: Secondary | ICD-10-CM

## 2018-05-25 DIAGNOSIS — Z3480 Encounter for supervision of other normal pregnancy, unspecified trimester: Secondary | ICD-10-CM

## 2018-05-25 NOTE — Progress Notes (Signed)
U/S order for outpatient, follow up anatomy.

## 2018-05-28 LAB — INHERITEST CORE(CF97,SMA,FRAX)

## 2018-06-09 ENCOUNTER — Ambulatory Visit: Payer: Self-pay

## 2018-06-10 ENCOUNTER — Ambulatory Visit: Admission: RE | Admit: 2018-06-10 | Payer: Medicaid Other | Source: Ambulatory Visit

## 2018-06-11 ENCOUNTER — Encounter: Payer: Medicaid Other | Admitting: Maternal Newborn

## 2018-06-11 ENCOUNTER — Other Ambulatory Visit: Payer: Medicaid Other

## 2018-06-14 ENCOUNTER — Encounter: Payer: Medicaid Other | Admitting: Maternal Newborn

## 2018-06-14 ENCOUNTER — Other Ambulatory Visit: Payer: Medicaid Other

## 2018-06-28 ENCOUNTER — Telehealth: Payer: Self-pay | Admitting: Maternal Newborn

## 2018-06-28 NOTE — Telephone Encounter (Signed)
I called and left voicemail for patient to call back to schedule for follow up anatomy scan and rob.

## 2018-07-05 ENCOUNTER — Ambulatory Visit (INDEPENDENT_AMBULATORY_CARE_PROVIDER_SITE_OTHER): Payer: Medicaid Other | Admitting: Advanced Practice Midwife

## 2018-07-05 ENCOUNTER — Encounter: Payer: Self-pay | Admitting: Advanced Practice Midwife

## 2018-07-05 ENCOUNTER — Ambulatory Visit (INDEPENDENT_AMBULATORY_CARE_PROVIDER_SITE_OTHER): Payer: Medicaid Other

## 2018-07-05 ENCOUNTER — Other Ambulatory Visit: Payer: Self-pay

## 2018-07-05 VITALS — BP 116/78 | Wt 199.0 lb

## 2018-07-05 DIAGNOSIS — Z130 Encounter for screening for diseases of the blood and blood-forming organs and certain disorders involving the immune mechanism: Secondary | ICD-10-CM

## 2018-07-05 DIAGNOSIS — Z113 Encounter for screening for infections with a predominantly sexual mode of transmission: Secondary | ICD-10-CM

## 2018-07-05 DIAGNOSIS — Z3482 Encounter for supervision of other normal pregnancy, second trimester: Secondary | ICD-10-CM

## 2018-07-05 DIAGNOSIS — Z3480 Encounter for supervision of other normal pregnancy, unspecified trimester: Secondary | ICD-10-CM

## 2018-07-05 DIAGNOSIS — Z369 Encounter for antenatal screening, unspecified: Secondary | ICD-10-CM

## 2018-07-05 DIAGNOSIS — Z131 Encounter for screening for diabetes mellitus: Secondary | ICD-10-CM

## 2018-07-05 DIAGNOSIS — Z362 Encounter for other antenatal screening follow-up: Secondary | ICD-10-CM

## 2018-07-05 DIAGNOSIS — Z3A25 25 weeks gestation of pregnancy: Secondary | ICD-10-CM

## 2018-07-05 NOTE — Patient Instructions (Signed)
Third Trimester of Pregnancy The third trimester is from week 28 through week 40 (months 7 through 9). The third trimester is a time when the unborn baby (fetus) is growing rapidly. At the end of the ninth month, the fetus is about 20 inches in length and weighs 6-10 pounds. Body changes during your third trimester Your body will continue to go through many changes during pregnancy. The changes vary from woman to woman. During the third trimester:  Your weight will continue to increase. You can expect to gain 25-35 pounds (11-16 kg) by the end of the pregnancy.  You may begin to get stretch marks on your hips, abdomen, and breasts.  You may urinate more often because the fetus is moving lower into your pelvis and pressing on your bladder.  You may develop or continue to have heartburn. This is caused by increased hormones that slow down muscles in the digestive tract.  You may develop or continue to have constipation because increased hormones slow digestion and cause the muscles that push waste through your intestines to relax.  You may develop hemorrhoids. These are swollen veins (varicose veins) in the rectum that can itch or be painful.  You may develop swollen, bulging veins (varicose veins) in your legs.  You may have increased body aches in the pelvis, back, or thighs. This is due to weight gain and increased hormones that are relaxing your joints.  You may have changes in your hair. These can include thickening of your hair, rapid growth, and changes in texture. Some women also have hair loss during or after pregnancy, or hair that feels dry or thin. Your hair will most likely return to normal after your baby is born.  Your breasts will continue to grow and they will continue to become tender. A yellow fluid (colostrum) may leak from your breasts. This is the first milk you are producing for your baby.  Your belly button may stick out.  You may notice more swelling in your hands,  face, or ankles.  You may have increased tingling or numbness in your hands, arms, and legs. The skin on your belly may also feel numb.  You may feel short of breath because of your expanding uterus.  You may have more problems sleeping. This can be caused by the size of your belly, increased need to urinate, and an increase in your body's metabolism.  You may notice the fetus "dropping," or moving lower in your abdomen (lightening).  You may have increased vaginal discharge.  You may notice your joints feel loose and you may have pain around your pelvic bone. What to expect at prenatal visits You will have prenatal exams every 2 weeks until week 36. Then you will have weekly prenatal exams. During a routine prenatal visit:  You will be weighed to make sure you and the baby are growing normally.  Your blood pressure will be taken.  Your abdomen will be measured to track your baby's growth.  The fetal heartbeat will be listened to.  Any test results from the previous visit will be discussed.  You may have a cervical check near your due date to see if your cervix has softened or thinned (effaced).  You will be tested for Group B streptococcus. This happens between 35 and 37 weeks. Your health care provider may ask you:  What your birth plan is.  How you are feeling.  If you are feeling the baby move.  If you have had any abnormal   symptoms, such as leaking fluid, bleeding, severe headaches, or abdominal cramping.  If you are using any tobacco products, including cigarettes, chewing tobacco, and electronic cigarettes.  If you have any questions. Other tests or screenings that may be performed during your third trimester include:  Blood tests that check for low iron levels (anemia).  Fetal testing to check the health, activity level, and growth of the fetus. Testing is done if you have certain medical conditions or if there are problems during the pregnancy.  Nonstress test  (NST). This test checks the health of your baby to make sure there are no signs of problems, such as the baby not getting enough oxygen. During this test, a belt is placed around your belly. The baby is made to move, and its heart rate is monitored during movement. What is false labor? False labor is a condition in which you feel small, irregular tightenings of the muscles in the womb (contractions) that usually go away with rest, changing position, or drinking water. These are called Braxton Hicks contractions. Contractions may last for hours, days, or even weeks before true labor sets in. If contractions come at regular intervals, become more frequent, increase in intensity, or become painful, you should see your health care provider. What are the signs of labor?  Abdominal cramps.  Regular contractions that start at 10 minutes apart and become stronger and more frequent with time.  Contractions that start on the top of the uterus and spread down to the lower abdomen and back.  Increased pelvic pressure and dull back pain.  A watery or bloody mucus discharge that comes from the vagina.  Leaking of amniotic fluid. This is also known as your "water breaking." It could be a slow trickle or a gush. Let your health care provider know if it has a color or strange odor. If you have any of these signs, call your health care provider right away, even if it is before your due date. Follow these instructions at home: Medicines  Follow your health care provider's instructions regarding medicine use. Specific medicines may be either safe or unsafe to take during pregnancy.  Take a prenatal vitamin that contains at least 600 micrograms (mcg) of folic acid.  If you develop constipation, try taking a stool softener if your health care provider approves. Eating and drinking   Eat a balanced diet that includes fresh fruits and vegetables, whole grains, good sources of protein such as meat, eggs, or tofu,  and low-fat dairy. Your health care provider will help you determine the amount of weight gain that is right for you.  Avoid raw meat and uncooked cheese. These carry germs that can cause birth defects in the baby.  If you have low calcium intake from food, talk to your health care provider about whether you should take a daily calcium supplement.  Eat four or five small meals rather than three large meals a day.  Limit foods that are high in fat and processed sugars, such as fried and sweet foods.  To prevent constipation: ? Drink enough fluid to keep your urine clear or pale yellow. ? Eat foods that are high in fiber, such as fresh fruits and vegetables, whole grains, and beans. Activity  Exercise only as directed by your health care provider. Most women can continue their usual exercise routine during pregnancy. Try to exercise for 30 minutes at least 5 days a week. Stop exercising if you experience uterine contractions.  Avoid heavy lifting.  Do   not exercise in extreme heat or humidity, or at high altitudes.  Wear low-heel, comfortable shoes.  Practice good posture.  You may continue to have sex unless your health care provider tells you otherwise. Relieving pain and discomfort  Take frequent breaks and rest with your legs elevated if you have leg cramps or low back pain.  Take warm sitz baths to soothe any pain or discomfort caused by hemorrhoids. Use hemorrhoid cream if your health care provider approves.  Wear a good support bra to prevent discomfort from breast tenderness.  If you develop varicose veins: ? Wear support pantyhose or compression stockings as told by your healthcare provider. ? Elevate your feet for 15 minutes, 3-4 times a day. Prenatal care  Write down your questions. Take them to your prenatal visits.  Keep all your prenatal visits as told by your health care provider. This is important. Safety  Wear your seat belt at all times when driving.  Make  a list of emergency phone numbers, including numbers for family, friends, the hospital, and police and fire departments. General instructions  Avoid cat litter boxes and soil used by cats. These carry germs that can cause birth defects in the baby. If you have a cat, ask someone to clean the litter box for you.  Do not travel far distances unless it is absolutely necessary and only with the approval of your health care provider.  Do not use hot tubs, steam rooms, or saunas.  Do not drink alcohol.  Do not use any products that contain nicotine or tobacco, such as cigarettes and e-cigarettes. If you need help quitting, ask your health care provider.  Do not use any medicinal herbs or unprescribed drugs. These chemicals affect the formation and growth of the baby.  Do not douche or use tampons or scented sanitary pads.  Do not cross your legs for long periods of time.  To prepare for the arrival of your baby: ? Take prenatal classes to understand, practice, and ask questions about labor and delivery. ? Make a trial run to the hospital. ? Visit the hospital and tour the maternity area. ? Arrange for maternity or paternity leave through employers. ? Arrange for family and friends to take care of pets while you are in the hospital. ? Purchase a rear-facing car seat and make sure you know how to install it in your car. ? Pack your hospital bag. ? Prepare the baby's nursery. Make sure to remove all pillows and stuffed animals from the baby's crib to prevent suffocation.  Visit your dentist if you have not gone during your pregnancy. Use a soft toothbrush to brush your teeth and be gentle when you floss. Contact a health care provider if:  You are unsure if you are in labor or if your water has broken.  You become dizzy.  You have mild pelvic cramps, pelvic pressure, or nagging pain in your abdominal area.  You have lower back pain.  You have persistent nausea, vomiting, or  diarrhea.  You have an unusual or bad smelling vaginal discharge.  You have pain when you urinate. Get help right away if:  Your water breaks before 37 weeks.  You have regular contractions less than 5 minutes apart before 37 weeks.  You have a fever.  You are leaking fluid from your vagina.  You have spotting or bleeding from your vagina.  You have severe abdominal pain or cramping.  You have rapid weight loss or weight gain.  You have   shortness of breath with chest pain.  You notice sudden or extreme swelling of your face, hands, ankles, feet, or legs.  Your baby makes fewer than 10 movements in 2 hours.  You have severe headaches that do not go away when you take medicine.  You have vision changes. Summary  The third trimester is from week 28 through week 40, months 7 through 9. The third trimester is a time when the unborn baby (fetus) is growing rapidly.  During the third trimester, your discomfort may increase as you and your baby continue to gain weight. You may have abdominal, leg, and back pain, sleeping problems, and an increased need to urinate.  During the third trimester your breasts will keep growing and they will continue to become tender. A yellow fluid (colostrum) may leak from your breasts. This is the first milk you are producing for your baby.  False labor is a condition in which you feel small, irregular tightenings of the muscles in the womb (contractions) that eventually go away. These are called Braxton Hicks contractions. Contractions may last for hours, days, or even weeks before true labor sets in.  Signs of labor can include: abdominal cramps; regular contractions that start at 10 minutes apart and become stronger and more frequent with time; watery or bloody mucus discharge that comes from the vagina; increased pelvic pressure and dull back pain; and leaking of amniotic fluid. This information is not intended to replace advice given to you by your  health care provider. Make sure you discuss any questions you have with your health care provider. Document Released: 03/04/2001 Document Revised: 04/15/2016 Document Reviewed: 04/15/2016 Elsevier Interactive Patient Education  2019 [employer]  

## 2018-07-05 NOTE — Progress Notes (Signed)
ROB Anatomy Scan/ It is a BOY!! 

## 2018-07-05 NOTE — Progress Notes (Signed)
Routine Prenatal Care Visit  Subjective  Joy Harrell is a 28 y.o. G3P2002 at 2829w4d being seen today for ongoing prenatal care.  She is currently monitored for the following issues for this low-risk pregnancy and has Supervision of other normal pregnancy, antepartum and Late prenatal care on their problem list.  ----------------------------------------------------------------------------------- Patient reports no complaints.    . Vag. Bleeding: None.  Movement: Present. Denies leaking of fluid.  ----------------------------------------------------------------------------------- The following portions of the patient's history were reviewed and updated as appropriate: allergies, current medications, past family history, past medical history, past social history, past surgical history and problem list. Problem list updated.   Objective  Blood pressure 116/78, weight 199 lb (90.3 kg), last menstrual period 01/07/2018. Pregravid weight 180 lb (81.6 kg) Total Weight Gain 19 lb (8.618 kg) Urinalysis: Urine Protein    Urine Glucose    Fetal Status: Fetal Heart Rate (bpm): 151   Movement: Present      Patient Name: Joy Harrell DOB: 06/29/1990 MRN: [id number]  ULTRASOUND REPORT  Location: [facility] Date of Service: 07/05/2018   Indications: Anatomy follow up ultrasound/Renal pelvictasis. Findings:  Singleton intrauterine pregnancy is visualized with FHR at 151 BPM.   Fetal presentation is Breech.  Placenta: anterior. Grade: 1 AFI: subjectively normal.  Lt renal pelvis fulling measuring 6 mm.Prior exam measurement was 4mm Rt renal pelvis fulling measuring 2 mm.Prior exam measuring 3mm Anatomic survey is complete.   There is no free peritoneal fluid in the cul de sac.  Impression: 1. 4029w4d Viable Singleton Intrauterine pregnancy previously established criteria. 2. Normal Anatomy Scan is now complete  3. Lt renal pelviectasis increase by 2 mm. Rt renal pelviectasis  decrease by 1 mm.  Recommendations: 1.Clinical correlation with the patient's History and Physical Exam.   Clydene LamingJenine M Alessi, RDMS  General:  Alert, oriented and cooperative. Patient is in no acute distress.  Skin: Skin is warm and dry. No rash noted.   Cardiovascular: Normal heart rate noted  Respiratory: Normal respiratory effort, no problems with respiration noted  Abdomen: Soft, gravid, appropriate for gestational age. Pain/Pressure: Absent     Pelvic:  Cervical exam deferred        Extremities: Normal range of motion.     Mental Status: Normal mood and affect. Normal behavior. Normal judgment and thought content.   Assessment   28 y.o. [id number] at 829w4d by  10/14/2018, by Last Menstrual Period presenting for routine prenatal visit  Plan   Pregnancy#3 Problems (from 01/07/18 to present)    Problem Noted Resolved   Supervision of other normal pregnancy, antepartum 05/07/2018 by Vena AustriaStaebler, Andreas, MD No   Overview Addendum 05/12/2018  8:39 AM by Vena AustriaStaebler, Andreas, MD    Clinic [facility] Prenatal Labs  Dating  Blood type: O pos  Genetic Screen 1 Screen:    AFP:     Quad:     NIPS: SMA:  FragileX:  CF: Antibody: negative  Anatomic US  Rubella: Immune Varicella: Immune  GTT  RPR: NR  Rhogam N/A HBsAg: negative  TDaP vaccine                       Flu Shot: 05/07/2018 HIV: negative  Baby Food                                GBS:   Contraception  GBS prior pregnancy: YES  Pelvis Tested  7lbs Pap: 05/07/2018  NIL HPV negative  CS/VBAC N/A   Support Person Eddie              Preterm labor symptoms and general obstetric precautions including but not limited to vaginal bleeding, contractions, leaking of fluid and fetal movement were reviewed in detail with the patient. Please refer to After Visit Summary for other counseling recommendations.   Return in about 3 weeks (around 07/26/2018) for 28 wk labs and rob.   Tresea Mall, CNM 07/05/2018 11:23 AM

## 2018-07-26 ENCOUNTER — Encounter: Payer: Medicaid Other | Admitting: Obstetrics and Gynecology

## 2018-07-26 ENCOUNTER — Other Ambulatory Visit: Payer: Medicaid Other

## 2018-08-13 ENCOUNTER — Other Ambulatory Visit: Payer: Medicaid Other

## 2018-08-13 ENCOUNTER — Encounter: Payer: Medicaid Other | Admitting: Maternal Newborn

## 2018-08-17 ENCOUNTER — Encounter: Payer: Medicaid Other | Admitting: Maternal Newborn

## 2018-08-17 ENCOUNTER — Other Ambulatory Visit: Payer: Medicaid Other

## 2018-08-23 ENCOUNTER — Ambulatory Visit (INDEPENDENT_AMBULATORY_CARE_PROVIDER_SITE_OTHER): Payer: Medicaid Other | Admitting: Obstetrics and Gynecology

## 2018-08-23 ENCOUNTER — Other Ambulatory Visit: Payer: Self-pay

## 2018-08-23 ENCOUNTER — Other Ambulatory Visit: Payer: Medicaid Other

## 2018-08-23 ENCOUNTER — Encounter: Payer: Self-pay | Admitting: Obstetrics and Gynecology

## 2018-08-23 ENCOUNTER — Other Ambulatory Visit: Payer: Self-pay | Admitting: Advanced Practice Midwife

## 2018-08-23 VITALS — BP 116/64 | Wt 201.0 lb

## 2018-08-23 DIAGNOSIS — O0930 Supervision of pregnancy with insufficient antenatal care, unspecified trimester: Secondary | ICD-10-CM

## 2018-08-23 DIAGNOSIS — Z23 Encounter for immunization: Secondary | ICD-10-CM

## 2018-08-23 DIAGNOSIS — Z3480 Encounter for supervision of other normal pregnancy, unspecified trimester: Secondary | ICD-10-CM

## 2018-08-23 DIAGNOSIS — Z3A32 32 weeks gestation of pregnancy: Secondary | ICD-10-CM

## 2018-08-23 DIAGNOSIS — O35EXX0 Maternal care for other (suspected) fetal abnormality and damage, fetal genitourinary anomalies, not applicable or unspecified: Secondary | ICD-10-CM

## 2018-08-23 DIAGNOSIS — O358XX0 Maternal care for other (suspected) fetal abnormality and damage, not applicable or unspecified: Secondary | ICD-10-CM

## 2018-08-23 NOTE — Progress Notes (Signed)
ROB/GTT Tdap/BT consent and BTL consent today  C/o hip pain, pelvic pressure Denies lof, no vb, Good FM

## 2018-08-23 NOTE — Progress Notes (Signed)
    Routine Prenatal Care Visit  Subjective  Joy Harrell is a 28 y.o. G3P2002 at [redacted]w[redacted]d being seen today for ongoing prenatal care.  She is currently monitored for the following issues for this low-risk pregnancy and has Supervision of other normal pregnancy, antepartum and Late prenatal care on their problem list.  ----------------------------------------------------------------------------------- Patient reports no complaints.   Contractions: Not present. Vag. Bleeding: None.  Movement: Present. Denies leaking of fluid.  ----------------------------------------------------------------------------------- The following portions of the patient's history were reviewed and updated as appropriate: allergies, current medications, past family history, past medical history, past social history, past surgical history and problem list. Problem list updated.   Objective  Blood pressure 116/64, weight 201 lb (91.2 kg), last menstrual period 01/07/2018. Pregravid weight 180 lb (81.6 kg) Total Weight Gain 21 lb (9.526 kg) Urinalysis:      Fetal Status: Fetal Heart Rate (bpm): 150 Fundal Height: 33 cm Movement: Present     General:  Alert, oriented and cooperative. Patient is in no acute distress.  Skin: Skin is warm and dry. No rash noted.   Cardiovascular: Normal heart rate noted  Respiratory: Normal respiratory effort, no problems with respiration noted  Abdomen: Soft, gravid, appropriate for gestational age. Pain/Pressure: Present     Pelvic:  Cervical exam deferred        Extremities: Normal range of motion.  Edema: None  Mental Status: Normal mood and affect. Normal behavior. Normal judgment and thought content.     Assessment   28 y.o. G3P2002 at [redacted]w[redacted]d by  10/14/2018, by Last Menstrual Period presenting for routine prenatal visit  Plan   Pregnancy#3 Problems (from 01/07/18 to present)    Problem Noted Resolved   Supervision of other normal pregnancy, antepartum 05/07/2018 by  Vena Austria, MD No   Overview Addendum 08/23/2018  3:58 PM by Natale Milch, MD    Clinic [facility] Prenatal Labs  Dating LMP = 18 wk [place] Blood type: O pos  Genetic Screen NIPS: normal Inheritest: Negative Antibody: negative  Anatomic [place] Complete, follow up renal [ ]  Rubella: Immune Varicella: Immune  GTT  RPR: NR  Rhogam N/A HBsAg: negative  TDaP vaccine  08/23/2018  Flu Shot: 05/07/2018 HIV: negative  Baby Food     Bottle                           GBS:   Contraception  Tubal  GBS prior pregnancy: YES  Pelvis Tested  7lbs Pap: 05/07/2018 NIL HPV negative  CS/VBAC N/A   Support Person Eddie                Gestational age appropriate obstetric precautions including but not limited to vaginal bleeding, contractions, leaking of fluid and fetal movement were reviewed in detail with the patient.    Tubal consent signed today. Patient understands a postpartum tubal ligation might not be able to occur because of the pandemic.  TDAP vaccination  Return in about 1 week (around 08/30/2018) for ROB in person and [place].  Natale Milch MD [facility], [facility] 08/23/2018, 3:56 PM

## 2018-08-24 LAB — 28 WEEK RH+PANEL
Basophils Absolute: 0.1 10*3/uL (ref 0.0–0.2)
Basos: 1 %
EOS (ABSOLUTE): 0.3 10*3/uL (ref 0.0–0.4)
Eos: 2 %
Gestational Diabetes Screen: 110 mg/dL (ref 65–139)
HIV Screen 4th Generation wRfx: NONREACTIVE
Hematocrit: 37.2 % (ref 34.0–46.6)
Hemoglobin: 12.7 g/dL (ref 11.1–15.9)
Immature Grans (Abs): 0.1 10*3/uL (ref 0.0–0.1)
Immature Granulocytes: 1 %
Lymphocytes Absolute: 3.3 10*3/uL — ABNORMAL HIGH (ref 0.7–3.1)
Lymphs: 20 %
MCH: 29.3 pg (ref 26.6–33.0)
MCHC: 34.1 g/dL (ref 31.5–35.7)
MCV: 86 fL (ref 79–97)
Monocytes Absolute: 0.9 10*3/uL (ref 0.1–0.9)
Monocytes: 5 %
Neutrophils Absolute: 12 10*3/uL — ABNORMAL HIGH (ref 1.4–7.0)
Neutrophils: 71 %
Platelets: 358 10*3/uL (ref 150–450)
RBC: 4.33 x10E6/uL (ref 3.77–5.28)
RDW: 12.7 % (ref 11.7–15.4)
RPR Ser Ql: NONREACTIVE
WBC: 16.8 10*3/uL — ABNORMAL HIGH (ref 3.4–10.8)

## 2018-08-30 ENCOUNTER — Encounter: Payer: Self-pay | Admitting: Obstetrics and Gynecology

## 2018-08-30 ENCOUNTER — Ambulatory Visit (INDEPENDENT_AMBULATORY_CARE_PROVIDER_SITE_OTHER): Payer: Medicaid Other | Admitting: Obstetrics and Gynecology

## 2018-08-30 ENCOUNTER — Other Ambulatory Visit: Payer: Self-pay

## 2018-08-30 ENCOUNTER — Ambulatory Visit (INDEPENDENT_AMBULATORY_CARE_PROVIDER_SITE_OTHER): Payer: Medicaid Other

## 2018-08-30 VITALS — BP 128/66 | Wt 202.0 lb

## 2018-08-30 DIAGNOSIS — O0933 Supervision of pregnancy with insufficient antenatal care, third trimester: Secondary | ICD-10-CM

## 2018-08-30 DIAGNOSIS — O0930 Supervision of pregnancy with insufficient antenatal care, unspecified trimester: Secondary | ICD-10-CM

## 2018-08-30 DIAGNOSIS — O358XX0 Maternal care for other (suspected) fetal abnormality and damage, not applicable or unspecified: Secondary | ICD-10-CM

## 2018-08-30 DIAGNOSIS — Z3A33 33 weeks gestation of pregnancy: Secondary | ICD-10-CM

## 2018-08-30 DIAGNOSIS — Z3480 Encounter for supervision of other normal pregnancy, unspecified trimester: Secondary | ICD-10-CM

## 2018-08-30 DIAGNOSIS — O35EXX0 Maternal care for other (suspected) fetal abnormality and damage, fetal genitourinary anomalies, not applicable or unspecified: Secondary | ICD-10-CM

## 2018-08-30 LAB — POCT URINALYSIS DIPSTICK OB: Glucose, UA: NEGATIVE

## 2018-08-30 NOTE — Progress Notes (Signed)
    Routine Prenatal Care Visit  Subjective  Joy Harrell is a 28 y.o. G3P2002 at [redacted]w[redacted]d being seen today for ongoing prenatal care.  She is currently monitored for the following issues for this low-risk pregnancy and has Supervision of other normal pregnancy, antepartum and Late prenatal care on their problem list.  ----------------------------------------------------------------------------------- Patient reports no complaints.   Contractions: Not present. Vag. Bleeding: None.  Movement: Present. Denies leaking of fluid.  ----------------------------------------------------------------------------------- The following portions of the patient's history were reviewed and updated as appropriate: allergies, current medications, past family history, past medical history, past social history, past surgical history and problem list. Problem list updated.   Objective  Blood pressure 128/66, weight 202 lb (91.6 kg), last menstrual period 01/07/2018. Pregravid weight 180 lb (81.6 kg) Total Weight Gain 22 lb (9.979 kg) Urinalysis:      Fetal Status: Fetal Heart Rate (bpm): 155 Fundal Height: 35 cm Movement: Present  Presentation: Vertex  General:  Alert, oriented and cooperative. Patient is in no acute distress.  Skin: Skin is warm and dry. No rash noted.   Cardiovascular: Normal heart rate noted  Respiratory: Normal respiratory effort, no problems with respiration noted  Abdomen: Soft, gravid, appropriate for gestational age. Pain/Pressure: Absent     Pelvic:  Cervical exam deferred        Extremities: Normal range of motion.     Mental Status: Normal mood and affect. Normal behavior. Normal judgment and thought content.     Assessment   28 y.o. [id number] at [redacted]w[redacted]d by  10/14/2018, by Last Menstrual Period presenting for routine prenatal visit  Plan   Pregnancy#3 Problems (from 01/07/18 to present)    Problem Noted Resolved   Supervision of other normal pregnancy, antepartum 05/07/2018 by  Malachy Mood, MD No   Overview Addendum 08/30/2018  2:34 PM by Homero Fellers, MD    Clinic [facility] Prenatal Labs  Dating LMP = 18 wk [place] Blood type: O pos  Genetic Screen NIPS: normal Inheritest: Negative Antibody: negative  Anatomic [place] Complete, follow up renal  Rubella: Immune Varicella: Immune  GTT  28 wk: 110 RPR: NR  Rhogam N/A HBsAg: negative  TDaP vaccine  08/23/2018  Flu Shot: 05/07/2018 HIV: negative  Baby Food     Bottle                           GBS:   Contraception  Tubal  GBS prior pregnancy: YES  Pelvis Tested  7lbs Pap: 05/07/2018 NIL HPV negative  CS/VBAC N/A   Support Person Eddie              Gestational age appropriate obstetric precautions including but not limited to vaginal bleeding, contractions, leaking of fluid and fetal movement were reviewed in detail with the patient.    [place] today- repeat growth in 4 weeks, fetal pyelectasis improved. Repeat ultrasound is recommended after delivery.  Return in about 2 weeks (around 09/13/2018) for ROb in person.  Homero Fellers MD [facility], [facility] 08/30/2018, 10:09 PM

## 2018-08-30 NOTE — Progress Notes (Signed)
ROB Ultrasound 

## 2018-09-13 ENCOUNTER — Encounter: Payer: Medicaid Other | Admitting: Obstetrics and Gynecology

## 2018-09-27 ENCOUNTER — Encounter: Payer: Self-pay | Admitting: Advanced Practice Midwife

## 2018-09-27 ENCOUNTER — Other Ambulatory Visit (HOSPITAL_COMMUNITY)
Admission: RE | Admit: 2018-09-27 | Discharge: 2018-09-27 | Disposition: A | Discharge status: Home / Self Care | Payer: Medicaid Other | Source: Ambulatory Visit | Attending: Obstetrics and Gynecology | Admitting: Obstetrics and Gynecology

## 2018-09-27 ENCOUNTER — Other Ambulatory Visit: Payer: Self-pay

## 2018-09-27 ENCOUNTER — Ambulatory Visit (INDEPENDENT_AMBULATORY_CARE_PROVIDER_SITE_OTHER): Payer: Medicaid Other | Admitting: Advanced Practice Midwife

## 2018-09-27 VITALS — BP 126/84 | Wt 209.0 lb

## 2018-09-27 DIAGNOSIS — Z113 Encounter for screening for infections with a predominantly sexual mode of transmission: Secondary | ICD-10-CM | POA: Diagnosis present

## 2018-09-27 DIAGNOSIS — Z3A37 37 weeks gestation of pregnancy: Secondary | ICD-10-CM | POA: Insufficient documentation

## 2018-09-27 DIAGNOSIS — Z3685 Encounter for antenatal screening for Streptococcus B: Secondary | ICD-10-CM

## 2018-09-27 DIAGNOSIS — Z3483 Encounter for supervision of other normal pregnancy, third trimester: Secondary | ICD-10-CM

## 2018-09-27 DIAGNOSIS — Z3480 Encounter for supervision of other normal pregnancy, unspecified trimester: Secondary | ICD-10-CM

## 2018-09-27 LAB — POCT URINALYSIS DIPSTICK OB
Glucose, UA: NEGATIVE
POC,PROTEIN,UA: NEGATIVE

## 2018-09-27 LAB — OB RESULTS CONSOLE GC/CHLAMYDIA: Gonorrhea: NEGATIVE

## 2018-09-27 NOTE — Progress Notes (Signed)
No vb. No lof. GBS today.  

## 2018-09-27 NOTE — Progress Notes (Signed)
  Routine Prenatal Care Visit  Subjective  Joy Harrell is a 28 y.o. G3P2002 at [redacted]w[redacted]d being seen today for ongoing prenatal care.  She is currently monitored for the following issues for this low-risk pregnancy and has Supervision of other normal pregnancy, antepartum and Late prenatal care on their problem list.  ----------------------------------------------------------------------------------- Patient reports no complaints.  Information given on circumcision clinic. She is considering BTL and has signed consent.  Contractions: Not present. Vag. Bleeding: None.  Movement: Present. Denies leaking of fluid.  ----------------------------------------------------------------------------------- The following portions of the patient's history were reviewed and updated as appropriate: allergies, current medications, past family history, past medical history, past social history, past surgical history and problem list. Problem list updated.   Objective  Blood pressure 126/84, weight 209 lb (94.8 kg), last menstrual period 01/07/2018. Pregravid weight 180 lb (81.6 kg) Total Weight Gain 29 lb (13.2 kg) Urinalysis: Urine Protein    Urine Glucose    Fetal Status: Fetal Heart Rate (bpm): 130 Fundal Height: 37 cm Movement: Present  Presentation: Vertex  General:  Alert, oriented and cooperative. Patient is in no acute distress.  Skin: Skin is warm and dry. No rash noted.   Cardiovascular: Normal heart rate noted  Respiratory: Normal respiratory effort, no problems with respiration noted  Abdomen: Soft, gravid, appropriate for gestational age. Pain/Pressure: Present     Pelvic:  Cervical exam performed Dilation: 1.5 Effacement (%): 60 Station: -3, GBS/aptima collected  Extremities: Normal range of motion.  Edema: None  Mental Status: Normal mood and affect. Normal behavior. Normal judgment and thought content.   Assessment   28 y.o. [id number] at [redacted]w[redacted]d by  10/14/2018, by Last Menstrual Period  presenting for routine prenatal visit  Plan   Pregnancy#3 Problems (from 01/07/18 to present)    Problem Noted Resolved   Supervision of other normal pregnancy, antepartum 05/07/2018 by Malachy Mood, MD No   Overview Addendum 08/30/2018  2:34 PM by Homero Fellers, MD    [facility] Prenatal Labs  Dating LMP = 18 wk [place] Blood type: O pos  Genetic Screen NIPS: normal Inheritest: Negative Antibody: negative  Anatomic [place] Complete, follow up renal  Rubella: Immune Varicella: Immune  GTT  28 wk: 110 RPR: NR  Rhogam N/A HBsAg: negative  TDaP vaccine  08/23/2018  Flu Shot: 05/07/2018 HIV: negative  Baby Food     Bottle                           GBS:   Contraception  Tubal ? GBS prior pregnancy: YES  Pelvis Tested  7lbs Pap: 05/07/2018 NIL HPV negative  CS/VBAC N/A   Support Person Eddie              Term labor symptoms and general obstetric precautions including but not limited to vaginal bleeding, contractions, leaking of fluid and fetal movement were reviewed in detail with the patient. Please refer to After Visit Summary for other counseling recommendations.   Return in about 1 week (around 10/04/2018) for growth scan and rob.   Newborn will need follow up kidney ultrasound  Rod Can, CNM 09/27/2018 9:43 AM

## 2018-09-27 NOTE — Patient Instructions (Signed)
Braxton Hicks Contractions Contractions of the uterus can occur throughout pregnancy, but they are not always a sign that you are in labor. You may have practice contractions called Braxton Hicks contractions. These false labor contractions are sometimes confused with true labor. What are Braxton Hicks contractions? Braxton Hicks contractions are tightening movements that occur in the muscles of the uterus before labor. Unlike true labor contractions, these contractions do not result in opening (dilation) and thinning of the cervix. Toward the end of pregnancy (32-34 weeks), Braxton Hicks contractions can happen more often and may become stronger. These contractions are sometimes difficult to tell apart from true labor because they can be very uncomfortable. You should not feel embarrassed if you go to the hospital with false labor. Sometimes, the only way to tell if you are in true labor is for your health care provider to look for changes in the cervix. The health care provider will do a physical exam and may monitor your contractions. If you are not in true labor, the exam should show that your cervix is not dilating and your water has not broken. If there are no other health problems associated with your pregnancy, it is completely safe for you to be sent home with false labor. You may continue to have Braxton Hicks contractions until you go into true labor. How to tell the difference between true labor and false labor True labor  Contractions last 30-70 seconds.  Contractions become very regular.  Discomfort is usually felt in the top of the uterus, and it spreads to the lower abdomen and low back.  Contractions do not go away with walking.  Contractions usually become more intense and increase in frequency.  The cervix dilates and gets thinner. False labor  Contractions are usually shorter and not as strong as true labor contractions.  Contractions are usually irregular.  Contractions  are often felt in the front of the lower abdomen and in the groin.  Contractions may go away when you walk around or change positions while lying down.  Contractions get weaker and are shorter-lasting as time goes on.  The cervix usually does not dilate or become thin. Follow these instructions at home:   Take over-the-counter and prescription medicines only as told by your health care provider.  Keep up with your usual exercises and follow other instructions from your health care provider.  Eat and drink lightly if you think you are going into labor.  If Braxton Hicks contractions are making you uncomfortable: ? Change your position from lying down or resting to walking, or change from walking to resting. ? Sit and rest in a tub of warm water. ? Drink enough fluid to keep your urine pale yellow. Dehydration may cause these contractions. ? Do slow and deep breathing several times an hour.  Keep all follow-up prenatal visits as told by your health care provider. This is important. Contact a health care provider if:  You have a fever.  You have continuous pain in your abdomen. Get help right away if:  Your contractions become stronger, more regular, and closer together.  You have fluid leaking or gushing from your vagina.  You pass blood-tinged mucus (bloody show).  You have bleeding from your vagina.  You have low back pain that you never had before.  You feel your baby's head pushing down and causing pelvic pressure.  Your baby is not moving inside you as much as it used to. Summary  Contractions that occur before labor are   called Braxton Hicks contractions, false labor, or practice contractions.  Braxton Hicks contractions are usually shorter, weaker, farther apart, and less regular than true labor contractions. True labor contractions usually become progressively stronger and regular, and they become more frequent.  Manage discomfort from Braxton Hicks contractions  by changing position, resting in a warm bath, drinking plenty of water, or practicing deep breathing. This information is not intended to replace advice given to you by your health care provider. Make sure you discuss any questions you have with your health care provider. Document Released: 07/24/2016 Document Revised: 02/20/2017 Document Reviewed: 07/24/2016 Elsevier Patient Education  2020 [employer]  

## 2018-09-27 NOTE — Addendum Note (Signed)
Addended by: Brien Few on: 09/27/2018 10:13 AM   Modules accepted: Orders

## 2018-09-29 LAB — CERVICOVAGINAL ANCILLARY ONLY
Chlamydia: NEGATIVE
Neisseria Gonorrhea: NEGATIVE
Trichomonas: NEGATIVE

## 2018-09-29 LAB — STREP GP B NAA: Strep Gp B NAA: NEGATIVE

## 2018-10-05 ENCOUNTER — Ambulatory Visit (INDEPENDENT_AMBULATORY_CARE_PROVIDER_SITE_OTHER): Payer: Medicaid Other

## 2018-10-05 ENCOUNTER — Other Ambulatory Visit: Payer: Self-pay

## 2018-10-05 ENCOUNTER — Encounter: Payer: Self-pay | Admitting: Maternal Newborn

## 2018-10-05 ENCOUNTER — Ambulatory Visit (INDEPENDENT_AMBULATORY_CARE_PROVIDER_SITE_OTHER): Payer: Medicaid Other | Admitting: Maternal Newborn

## 2018-10-05 VITALS — BP 110/70 | Wt 210.0 lb

## 2018-10-05 DIAGNOSIS — Z362 Encounter for other antenatal screening follow-up: Secondary | ICD-10-CM

## 2018-10-05 DIAGNOSIS — Z3A38 38 weeks gestation of pregnancy: Secondary | ICD-10-CM

## 2018-10-05 DIAGNOSIS — Z3480 Encounter for supervision of other normal pregnancy, unspecified trimester: Secondary | ICD-10-CM

## 2018-10-05 DIAGNOSIS — Z3483 Encounter for supervision of other normal pregnancy, third trimester: Secondary | ICD-10-CM

## 2018-10-05 LAB — POCT URINALYSIS DIPSTICK OB
Glucose, UA: NEGATIVE
POC,PROTEIN,UA: NEGATIVE

## 2018-10-05 NOTE — Progress Notes (Signed)
ROB Ultrasound 

## 2018-10-05 NOTE — Patient Instructions (Signed)

## 2018-10-05 NOTE — Progress Notes (Signed)
    Routine Prenatal Care Visit  Subjective  Joy Harrell is a 28 y.o. G3P2002 at [redacted]w[redacted]d being seen today for ongoing prenatal care.  She is currently monitored for the following issues for this low-risk pregnancy and has Supervision of other normal pregnancy, antepartum and Late prenatal care on their problem list.  ----------------------------------------------------------------------------------- Patient reports occasional contractions, pelvic pressure. Contractions: Irritability. Vag. Bleeding: None.  Movement: Present. No leaking of fluid.  ----------------------------------------------------------------------------------- The following portions of the patient's history were reviewed and updated as appropriate: allergies, current medications, past family history, past medical history, past social history, past surgical history and problem list. Problem list updated.   Objective  Blood pressure 110/70, weight 210 lb (95.3 kg), last menstrual period 01/07/2018. Pregravid weight 180 lb (81.6 kg) Total Weight Gain 30 lb (13.6 kg) Urinalysis: Urine dipstick shows negative for glucose, protein.  Fetal Status: Fetal Heart Rate (bpm): Present-normal   Movement: Present  Presentation: Vertex  General:  Alert, oriented and cooperative. Patient is in no acute distress.  Skin: Skin is warm and dry. No rash noted.   Cardiovascular: Normal heart rate noted  Respiratory: Normal respiratory effort, no problems with respiration noted  Abdomen: Soft, gravid, appropriate for gestational age. Pain/Pressure: Present     Pelvic:  Cervical exam performed Dilation: 1.5 Effacement (%): 60 Station: -2  Extremities: Normal range of motion.  Edema: None  Mental Status: Normal mood and affect. Normal behavior. Normal judgment and thought content.     Assessment   29 y.o. G8Q7619 at [redacted]w[redacted]d, EDD 10/14/2018 by Last Menstrual Period presenting for a routine prenatal visit.  Plan   Pregnancy#3 Problems  (from 01/07/18 to present)    Problem Noted Resolved   Supervision of other normal pregnancy, antepartum 05/07/2018 by Malachy Mood, MD No   Overview Addendum 08/30/2018  2:34 PM by Homero Fellers, MD    [facility] Prenatal Labs  Dating LMP = 18 wk [place] Blood type: O pos  Genetic Screen NIPS: normal Inheritest: Negative Antibody: negative  Anatomic [place] Complete, follow up renal  Rubella: Immune Varicella: Immune  GTT  28 wk: 110 RPR: NR  Rhogam N/A HBsAg: negative  TDaP vaccine  08/23/2018  Flu Shot: 05/07/2018 HIV: negative  Baby Food     Bottle                           GBS:   Contraception  Tubal  GBS prior pregnancy: YES  Pelvis Tested  7lbs Pap: 05/07/2018 NIL HPV negative  CS/VBAC N/A   Support Person Eddie             Growth at 57.7%, EFW 7 lb 9 oz, cephalic presentation. AFI normal at 18.3 cm. FHR 132 bpm. Femur and humerus bones measuring small, left renal pelvis is dilated. Reviewed results with patient.  Induction of labor discussed, prefers expectant management until after 40 weeks. Revisit at next appointment.  Term labor symptoms and general obstetric precautions including but not limited to vaginal bleeding, contractions, leaking of fluid and fetal movement were reviewed.  Please refer to After Visit Summary for other counseling recommendations.   Return in about 1 week (around 10/12/2018) for ROB.  Avel Sensor, CNM 10/05/2018  10:07 AM

## 2018-10-07 ENCOUNTER — Other Ambulatory Visit: Payer: Self-pay

## 2018-10-07 ENCOUNTER — Inpatient Hospital Stay
Admission: EM | Admit: 2018-10-07 | Discharge: 2018-10-08 | DRG: 806 | Disposition: A | Discharge status: Home / Self Care | Payer: Medicaid Other | Attending: Obstetrics and Gynecology | Admitting: Obstetrics and Gynecology

## 2018-10-07 DIAGNOSIS — D62 Acute posthemorrhagic anemia: Secondary | ICD-10-CM | POA: Diagnosis not present

## 2018-10-07 DIAGNOSIS — F17210 Nicotine dependence, cigarettes, uncomplicated: Secondary | ICD-10-CM | POA: Diagnosis present

## 2018-10-07 DIAGNOSIS — O99334 Smoking (tobacco) complicating childbirth: Secondary | ICD-10-CM | POA: Diagnosis present

## 2018-10-07 DIAGNOSIS — O26893 Other specified pregnancy related conditions, third trimester: Secondary | ICD-10-CM | POA: Diagnosis present

## 2018-10-07 DIAGNOSIS — O99324 Drug use complicating childbirth: Secondary | ICD-10-CM | POA: Diagnosis present

## 2018-10-07 DIAGNOSIS — O9081 Anemia of the puerperium: Secondary | ICD-10-CM | POA: Diagnosis not present

## 2018-10-07 DIAGNOSIS — Z3480 Encounter for supervision of other normal pregnancy, unspecified trimester: Secondary | ICD-10-CM

## 2018-10-07 DIAGNOSIS — F1290 Cannabis use, unspecified, uncomplicated: Secondary | ICD-10-CM | POA: Diagnosis present

## 2018-10-07 DIAGNOSIS — Z1159 Encounter for screening for other viral diseases: Secondary | ICD-10-CM

## 2018-10-07 DIAGNOSIS — O358XX0 Maternal care for other (suspected) fetal abnormality and damage, not applicable or unspecified: Secondary | ICD-10-CM | POA: Diagnosis not present

## 2018-10-07 DIAGNOSIS — Z3A39 39 weeks gestation of pregnancy: Secondary | ICD-10-CM

## 2018-10-07 HISTORY — DX: Tobacco use: Z72.0

## 2018-10-07 LAB — URINE DRUG SCREEN, QUALITATIVE (ARMC ONLY)
Amphetamines, Ur Screen: NOT DETECTED
Barbiturates, Ur Screen: NOT DETECTED
Benzodiazepine, Ur Scrn: NOT DETECTED
Cannabinoid 50 Ng, Ur ~~LOC~~: POSITIVE — AB
Cocaine Metabolite,Ur ~~LOC~~: NOT DETECTED
MDMA (Ecstasy)Ur Screen: NOT DETECTED
Methadone Scn, Ur: NOT DETECTED
Opiate, Ur Screen: NOT DETECTED
Phencyclidine (PCP) Ur S: NOT DETECTED
Tricyclic, Ur Screen: NOT DETECTED

## 2018-10-07 LAB — TYPE AND SCREEN
ABO/RH(D): O POS
Antibody Screen: NEGATIVE

## 2018-10-07 LAB — CBC
HCT: 38 % (ref 36.0–46.0)
Hemoglobin: 12.6 g/dL (ref 12.0–15.0)
MCH: 28.6 pg (ref 26.0–34.0)
MCHC: 33.2 g/dL (ref 30.0–36.0)
MCV: 86.2 fL (ref 80.0–100.0)
Platelets: 336 10*3/uL (ref 150–400)
RBC: 4.41 MIL/uL (ref 3.87–5.11)
RDW: 13.8 % (ref 11.5–15.5)
WBC: 20.9 10*3/uL — ABNORMAL HIGH (ref 4.0–10.5)
nRBC: 0 % (ref 0.0–0.2)

## 2018-10-07 LAB — SARS CORONAVIRUS 2 BY RT PCR (HOSPITAL ORDER, PERFORMED IN ~~LOC~~ HOSPITAL LAB): SARS Coronavirus 2: NEGATIVE

## 2018-10-07 MED ORDER — LACTATED RINGERS IV SOLN: INTRAVENOUS | Status: DC

## 2018-10-07 MED ORDER — FAMOTIDINE 20 MG PO TABS
20.0000 mg | ORAL_TABLET | Freq: Two times a day (BID) | ORAL | Status: DC
  Administered 2018-10-07 – 2018-10-08 (×3): 20 mg via ORAL
  Filled 2018-10-07 (×3): qty 1

## 2018-10-07 MED ORDER — OXYTOCIN 10 UNIT/ML IJ SOLN
INTRAMUSCULAR | Status: AC
  Filled 2018-10-07: qty 2

## 2018-10-07 MED ORDER — DIBUCAINE (PERIANAL) 1 % EX OINT: 1.0000 "application " | TOPICAL_OINTMENT | CUTANEOUS | Status: DC | PRN

## 2018-10-07 MED ORDER — OXYCODONE-ACETAMINOPHEN 5-325 MG PO TABS: 2.0000 | ORAL_TABLET | ORAL | Status: DC | PRN

## 2018-10-07 MED ORDER — ONDANSETRON HCL 4 MG/2ML IJ SOLN: 4.0000 mg | INTRAMUSCULAR | Status: DC | PRN

## 2018-10-07 MED ORDER — LACTATED RINGERS IV SOLN: 500.0000 mL | INTRAVENOUS | Status: DC | PRN

## 2018-10-07 MED ORDER — LIDOCAINE HCL (PF) 1 % IJ SOLN: 30.0000 mL | INTRAMUSCULAR | Status: DC | PRN

## 2018-10-07 MED ORDER — BUTORPHANOL TARTRATE 2 MG/ML IJ SOLN
INTRAMUSCULAR | Status: AC
  Filled 2018-10-07: qty 1

## 2018-10-07 MED ORDER — ONDANSETRON HCL 4 MG PO TABS: 4.0000 mg | ORAL_TABLET | ORAL | Status: DC | PRN

## 2018-10-07 MED ORDER — DIPHENHYDRAMINE HCL 25 MG PO CAPS: 25.0000 mg | ORAL_CAPSULE | Freq: Four times a day (QID) | ORAL | Status: DC | PRN

## 2018-10-07 MED ORDER — ONDANSETRON HCL 4 MG/2ML IJ SOLN: 4.0000 mg | Freq: Four times a day (QID) | INTRAMUSCULAR | Status: DC | PRN

## 2018-10-07 MED ORDER — BENZOCAINE-MENTHOL 20-0.5 % EX AERO: 1.0000 "application " | INHALATION_SPRAY | CUTANEOUS | Status: DC | PRN

## 2018-10-07 MED ORDER — OXYTOCIN BOLUS FROM INFUSION
500.0000 mL | Freq: Once | INTRAVENOUS | Status: AC
  Administered 2018-10-07: 500 mL via INTRAVENOUS

## 2018-10-07 MED ORDER — MISOPROSTOL 200 MCG PO TABS: 800.0000 ug | ORAL_TABLET | Freq: Once | ORAL | Status: DC | PRN

## 2018-10-07 MED ORDER — PRENATAL MULTIVITAMIN CH
1.0000 | ORAL_TABLET | Freq: Every day | ORAL | Status: DC
  Administered 2018-10-07 – 2018-10-08 (×2): 1 via ORAL
  Filled 2018-10-07 (×2): qty 1

## 2018-10-07 MED ORDER — COCONUT OIL OIL: 1.0000 "application " | TOPICAL_OIL | Status: DC | PRN

## 2018-10-07 MED ORDER — BUTORPHANOL TARTRATE 2 MG/ML IJ SOLN
1.0000 mg | Freq: Once | INTRAMUSCULAR | Status: AC
  Administered 2018-10-07: 1 mg via INTRAVENOUS

## 2018-10-07 MED ORDER — OXYTOCIN 40 UNITS IN NORMAL SALINE INFUSION - SIMPLE MED: 2.5000 [IU]/h | INTRAVENOUS | Status: DC

## 2018-10-07 MED ORDER — SIMETHICONE 80 MG PO CHEW: 80.0000 mg | CHEWABLE_TABLET | ORAL | Status: DC | PRN

## 2018-10-07 MED ORDER — ACETAMINOPHEN 325 MG PO TABS: 650.0000 mg | ORAL_TABLET | ORAL | Status: DC | PRN

## 2018-10-07 MED ORDER — OXYCODONE-ACETAMINOPHEN 5-325 MG PO TABS: 1.0000 | ORAL_TABLET | ORAL | Status: DC | PRN

## 2018-10-07 MED ORDER — IBUPROFEN 600 MG PO TABS
600.0000 mg | ORAL_TABLET | Freq: Four times a day (QID) | ORAL | Status: DC
  Administered 2018-10-07 – 2018-10-08 (×4): 600 mg via ORAL
  Filled 2018-10-07 (×4): qty 1

## 2018-10-07 MED ORDER — FAMOTIDINE 20 MG PO TABS
ORAL_TABLET | ORAL | Status: AC
  Filled 2018-10-07: qty 1

## 2018-10-07 MED ORDER — BUTORPHANOL TARTRATE 1 MG/ML IJ SOLN: 1.0000 mg | INTRAMUSCULAR | Status: DC | PRN

## 2018-10-07 MED ORDER — FAMOTIDINE 20 MG PO TABS
20.0000 mg | ORAL_TABLET | Freq: Two times a day (BID) | ORAL | Status: DC | PRN
  Administered 2018-10-07: 20 mg via ORAL

## 2018-10-07 MED ORDER — AMMONIA AROMATIC IN INHA: 0.3000 mL | Freq: Once | RESPIRATORY_TRACT | Status: DC | PRN

## 2018-10-07 MED ORDER — LIDOCAINE HCL (PF) 1 % IJ SOLN
INTRAMUSCULAR | Status: AC
  Filled 2018-10-07: qty 30

## 2018-10-07 MED ORDER — MISOPROSTOL 200 MCG PO TABS
ORAL_TABLET | ORAL | Status: AC
  Filled 2018-10-07: qty 4

## 2018-10-07 MED ORDER — OXYTOCIN 40 UNITS IN NORMAL SALINE INFUSION - SIMPLE MED
2.5000 [IU]/h | INTRAVENOUS | Status: DC
  Filled 2018-10-07: qty 1000

## 2018-10-07 MED ORDER — LACTATED RINGERS IV SOLN
INTRAVENOUS | Status: DC
  Administered 2018-10-07: 03:00:00 via INTRAVENOUS

## 2018-10-07 MED ORDER — SENNOSIDES-DOCUSATE SODIUM 8.6-50 MG PO TABS
2.0000 | ORAL_TABLET | ORAL | Status: DC
  Administered 2018-10-07: 2 via ORAL
  Filled 2018-10-07: qty 2

## 2018-10-07 MED ORDER — AMMONIA AROMATIC IN INHA
RESPIRATORY_TRACT | Status: AC
  Filled 2018-10-07: qty 10

## 2018-10-07 MED ORDER — WITCH HAZEL-GLYCERIN EX PADS: 1.0000 "application " | MEDICATED_PAD | CUTANEOUS | Status: DC | PRN

## 2018-10-07 MED ORDER — ACETAMINOPHEN 325 MG PO TABS
650.0000 mg | ORAL_TABLET | ORAL | Status: DC | PRN
  Administered 2018-10-08: 09:00:00 650 mg via ORAL
  Filled 2018-10-07: qty 2

## 2018-10-07 MED ORDER — FERROUS SULFATE 325 (65 FE) MG PO TABS
325.0000 mg | ORAL_TABLET | Freq: Every day | ORAL | Status: DC
  Administered 2018-10-08: 325 mg via ORAL
  Filled 2018-10-07: qty 1

## 2018-10-07 NOTE — Discharge Summary (Signed)
OB Discharge Summary     Patient Name: Joy Harrell DOB: 09/14/1990 MRN: [id number]  Date of admission: 10/07/2018 Delivering Provider: Oswaldo ConroyJacelyn Y Harrell, CNM  Date of Delivery: 10/07/2018  Date of discharge: 10/08/2018  Admitting diagnosis: 39 weeks. Contractions Intrauterine pregnancy: 6343w0d     Secondary diagnosis: None     Discharge diagnosis: Term Pregnancy Delivered                        Hospital course:  Onset of Labor With Vaginal Delivery     28 y.o. yo G3P3003 at 8643w0d was admitted in Active Labor on 10/07/2018. Patient had a labor course as follows:  Membrane Rupture Time/Date: 8:24 AM ,10/07/2018   Intrapartum Procedures: Episiotomy: None [1]                                         Lacerations:  None [1]  There was meconium stained amniotic fluid. Patient had a delivery of a Viable infant. 10/07/2018  Information for the patient's newborn:  Joy Harrell, Joy Harrell [[id number]]  Delivery Method: Vag-Spont     Patient had an uncomplicated postpartum course.  She is ambulating, tolerating a regular diet, passing flatus, and urinating well. Patient is discharged home in stable condition on 10/08/18.                                                                  Post partum procedures:none  Complications: None  Physical exam on 10/08/2018: Vitals:   10/07/18 1951 10/08/18 0012 10/08/18 0350 10/08/18 0849  BP: 118/86 (!) 123/52 125/84 116/78  Pulse: 72 65 (!) 58 70  Resp: 18 18 18 20   Temp: 98 F (36.7 C) 98 F (36.7 C) 98 F (36.7 C) 98 F (36.7 C)  TempSrc: Oral Oral Oral Oral  SpO2: 97% 98% 100% 100%  Weight:      Height:       General: alert, cooperative and no distress Lochia: appropriate Uterine Fundus: firm Incision: N/A DVT Evaluation: No evidence of DVT seen on physical exam.  Labs: Lab Results  Component Value Date   WBC 18.9 (H) 10/08/2018   HGB 11.3 (L) 10/08/2018   HCT 34.3 (L) 10/08/2018   MCV 86.4 10/08/2018   PLT 289 10/08/2018   CMP  Latest Ref Rng & Units 06/24/2014  Glucose mg/dL 782(N104(H)  BUN 7 - 18 mg/dL -  Creatinine 5.620.60 - 1.301.30 mg/dL -  Sodium 865136 - 784145 mmol/L -  Potassium 3.5 - 5.1 mmol/L -  Chloride 98 - 107 mmol/L -  CO2 21 - 32 mmol/L -  Calcium 8.5 - 10.1 mg/dL -  Total Protein 6.4 - 8.2 g/dL -  Total Bilirubin 0.2 - 1.0 mg/dL -  Alkaline Phos Unit/L -  AST 15 - 37 Unit/L -  ALT U/L -    Discharge instruction: per After Visit Summary.  Medications:  Allergies as of 10/08/2018   No Known Allergies     Medication List    STOP taking these medications   famotidine 20 MG tablet Commonly known as: PEPCID     TAKE these medications   multivitamin-prenatal 27-0.8 MG  Tabs tablet Take 1 tablet by mouth daily at 12 noon.       Diet: routine diet  Activity: Advance as tolerated. Pelvic rest for 6 weeks.   Outpatient follow up: Follow-up Information    Joy Harrell, CNM. Schedule an appointment as soon as possible for a visit in 6 week(s).   Specialties: Certified Nurse Midwife, Radiology Why: Postpartum visit Contact information: [address] [place] [address] [phone]             Postpartum contraception: Tubal Ligation Rhogam Given postpartum: no Rubella vaccine given postpartum: no Varicella vaccine given postpartum: no TDaP given antepartum or postpartum: Yes, antepartum on 08/23/2018  Newborn Data: Live born female  Birth Weight: 3220 g  APGAR: 9, 9  Newborn Delivery   Birth date/time: 10/07/2018 11:44:00 Delivery type: Vaginal, Spontaneous       Baby Feeding: Formula   Disposition:home with mother  SIGNED:  Rod Harrell, CNM 10/08/2018 11:11 AM

## 2018-10-07 NOTE — OB Triage Note (Signed)
PT is a G3 P2 at 39w 0d with c/o ctx that began today and got worse about 2330. PT denies LOF and VB. PT states positive fetal movement. Initial FHT 135. Monitor applied and assessing.

## 2018-10-07 NOTE — Progress Notes (Signed)
  Labor Progress Note   28 y.o. [id number] @ [redacted]w[redacted]d , admitted for  Pregnancy, Labor Management.   Subjective:  Ambulatory, coping with contractions. Felt a gush of fluid when she stood up after voiding  Objective:  BP (!) 106/45 (BP Location: Right Arm)   Pulse 75   Temp 97.7 F (36.5 C) (Oral)   Resp 16   Ht 5\' 8"  (1.727 m)   Wt 95.3 kg   LMP 01/07/2018 (LMP Unknown)   BMI 31.93 kg/m  Abd: gravid, non-tender Extr: trace to 1+ bilateral pedal edema SVE: 4.5/80/-1, small amount of clear fluid leaking (SROM)  EFM: FHR: 135 bpm, variability: moderate,  accelerations:  Present,  decelerations:  Absent Toco: Frequency: Every 2-3.5 minutes, Duration: 40-70 seconds and Intensity: mild Labs: I have reviewed the patient's lab results. Drug screen positive for marijuana.   Assessment & Plan:  [id number] @ [redacted]w[redacted]d, admitted for  Pregnancy and Labor/Delivery Management  1. Pain management: currently none, patient choice 2. FWB: FHT category I.  3. ID: GBS negative 4. Labor management: continue to monitor cervical change, consider augmentation as needed.  Avel Sensor, CNM 10/07/2018  10:06 AM

## 2018-10-07 NOTE — H&P (Signed)
OB History & Physical   History of Present Illness:  Chief Complaint:   HPI:  Joy Harrell is a 28 y.o. G44P2002 female with EDC=10/14/2018 at [redacted]w[redacted]d dated by LMP.  Her pregnancy has been complicated by late entry to care in second trimester, tobacco use, left fetal pelviectasis (1cm), and MJ use.  She presents to L&D for evaluation of labor. Had been contracting all day yesterday, but contractions became more frequent at HS and more intense at 0100 this AM. Denies leakage of fluid and vaginal bleeding. Baby active, . Prenatal care site: Prenatal care at [facility] has been remarkable for  [facility] Prenatal Labs  Dating LMP = 18 wk [place] Blood type: O pos  Genetic Screen NIPS: normal Inheritest: Negative Antibody: negative  Anatomic [place] Complete, follow up renal  Rubella: Immune Varicella: Immune  GTT  28 wk: 110 RPR: NR  Rhogam N/A HBsAg: negative  TDaP vaccine  08/23/2018  Flu Shot: 05/07/2018 HIV: negative  Baby Food     Bottle                           GBS: negative  Contraception  Tubal  GBS prior pregnancy: YES  Pelvis Tested  7lbs Pap: 05/07/2018 NIL HPV negative  CS/VBAC N/A   Support Person Joy Harrell         Maternal Medical History:   Past Medical History:  Diagnosis Date  . Tobacco use     Past Surgical History:  Procedure Laterality Date  . NO PAST SURGERIES      No Known Allergies  Prior to Admission medications   Medication Sig Start Date End Date Taking? Authorizing Provider  famotidine (PEPCID) 20 MG tablet Take 1 tablet (20 mg total) by mouth 2 (two) times daily. 05/14/18  Yes Rexene Agent, CNM  Prenatal Vit-Fe Fumarate-FA (MULTIVITAMIN-PRENATAL) 27-0.8 MG TABS tablet Take 1 tablet by mouth daily at 12 noon.   Yes [provider]          Social History: She  reports that she has been smoking cigarettes. She has been smoking about 0.25 packs per day. She has never used smokeless tobacco. She reports previous alcohol use. She  reports current drug use. Drug: Marijuana.  Family History: family history includes Breast cancer (age of onset: 63) in her maternal grandmother.   Review of Systems: Negative x 10 systems reviewed except as noted in the HPI.      Physical Exam:  Vital Signs: BP (!) 143/86 (BP Location: Left Arm)   Pulse 89   Temp 98.6 F (37 C) (Oral)   Resp 17   Ht 5\' 8"  (1.727 m)   Wt 95.3 kg   LMP 01/07/2018 (LMP Unknown)   BMI 31.93 kg/m  General: WF in  no acute distress. Is breathing thru some contractions HEENT: normocephalic, atraumatic Heart: regular rate & rhythm.  No murmurs/rubs/gallops Lungs: normal respiratory effort, wheezing in right base, left lung CTA Abdomen: soft, gravid, non-tender;  EFW: 7#8oz Pelvic:   External: Normal external female genitalia  Cervix: Dilation: 4 / Effacement (%): 60 / Station: -2   Extremities: non-tender, symmetric, no edema bilaterally.  DTRs: +1  Neurologic: Alert & oriented x 3.   Baseline FHR: 130 baseline with accelerations to 140s to 150, moderate variability Toco: contractions every 1-6 minutes apart  Assessment:  Joy Harrell is a 28 y.o. G36P2002 female at [redacted]w[redacted]d in early labor.   Plan:  1. Admit to Labor & Delivery -expectant management -anticipate vaginal delivery 2. CBC, T&S, Clrs, IVF 3. GBS negative.   4. Consents obtained. 5. O POS/ RI/VI 6. Has signed 30 day papers for BTL, but having second thoughts 7. TDAP given 08/23/2018 8. Bottle feeding 9. Notify peds about fetal pelviectasis.  Joy Harrell  10/07/2018 2:50 AM

## 2018-10-08 LAB — CBC
HCT: 34.3 % — ABNORMAL LOW (ref 36.0–46.0)
Hemoglobin: 11.3 g/dL — ABNORMAL LOW (ref 12.0–15.0)
MCH: 28.5 pg (ref 26.0–34.0)
MCHC: 32.9 g/dL (ref 30.0–36.0)
MCV: 86.4 fL (ref 80.0–100.0)
Platelets: 289 10*3/uL (ref 150–400)
RBC: 3.97 MIL/uL (ref 3.87–5.11)
RDW: 13.7 % (ref 11.5–15.5)
WBC: 18.9 10*3/uL — ABNORMAL HIGH (ref 4.0–10.5)
nRBC: 0 % (ref 0.0–0.2)

## 2018-10-08 LAB — RPR: RPR Ser Ql: NONREACTIVE

## 2018-10-08 NOTE — Progress Notes (Signed)
Subjective:  Doing well Postpartum Day 1: Patient is tolerating regular diet and her pain is controlled with PO medication. She is ambulating and voiding without difficulty. She was considering a tubal ligation for birth control but decided against it and would like Nexplanon. She requests discharge to home today pending newborn screening at 24 hours.  Objective:  Vital signs in last 24 hours: Temp:  [98 F (36.7 C)-98.7 F (37.1 C)] 98 F (36.7 C) (07/17 0849) Pulse Rate:  [58-90] 70 (07/17 0849) Resp:  [18-20] 20 (07/17 0849) BP: (116-141)/(52-86) 116/78 (07/17 0849) SpO2:  [97 %-100 %] 100 % (07/17 0849)    General: NAD Pulmonary: no increased work of breathing Abdomen: non-distended, non-tender, fundus firm at level of umbilicus Extremities: no edema, no erythema, no tenderness  Results for orders placed or performed during the hospital encounter of 10/07/18 (from the past 72 hour(s))  Type and screen [facility]     Status: None   Collection Time: 10/07/18  2:33 AM  Result Value Ref Range   ABO/RH(D) O POS    Antibody Screen NEG    Sample Expiration      10/10/2018,2359 Performed at [facility], [address] [place], [place] [address]   CBC     Status: Abnormal   Collection Time: 10/07/18  2:33 AM  Result Value Ref Range   WBC 20.9 (H) 4.0 - 10.5 K/uL   RBC 4.41 3.87 - 5.11 MIL/uL   Hemoglobin 12.6 12.0 - 15.0 g/dL   HCT 81.138.0 91.436.0 - 78.246.0 %   MCV 86.2 80.0 - 100.0 fL   MCH 28.6 26.0 - 34.0 pg   MCHC 33.2 30.0 - 36.0 g/dL   RDW 95.613.8 21.311.5 - 08.615.5 %   Platelets 336 150 - 400 K/uL   nRBC 0.0 0.0 - 0.2 %    Comment: Performed at [facility], [address] [place], [place] [address]  RPR     Status: None   Collection Time: 10/07/18  2:33 AM  Result Value Ref Range   RPR Ser Ql Non Reactive Non Reactive    Comment: (NOTE) Performed At: [facility] [address] [place], [place] [id number] Jolene SchimkeNagendra Sanjai MD  LK:[id number]:[phone]   SARS Coronavirus 2 (CEPHEID - Performed in [facility]), Hosp Order     Status: None   Collection Time: 10/07/18  3:57 AM   Specimen: Nasopharyngeal Swab  Result Value Ref Range   SARS Coronavirus 2 NEGATIVE NEGATIVE    Comment: (NOTE) If result is NEGATIVE SARS-CoV-2 target nucleic acids are NOT DETECTED. The SARS-CoV-2 RNA is generally detectable in upper and lower  respiratory specimens during the acute phase of infection. The lowest  concentration of SARS-CoV-2 viral copies this assay can detect is 250  copies / mL. A negative result does not preclude SARS-CoV-2 infection  and should not be used as the sole basis for treatment or other  patient management decisions.  A negative result may occur with  improper specimen collection / handling, submission of specimen other  than nasopharyngeal swab, presence of viral mutation(s) within the  areas targeted by this assay, and inadequate number of viral copies  (<250 copies / mL). A negative result must be combined with clinical  observations, patient history, and epidemiological information. If result is POSITIVE SARS-CoV-2 target nucleic acids are DETECTED. The SARS-CoV-2 RNA is generally detectable in upper and lower  respiratory specimens dur ing the acute phase of infection.  Positive  results are indicative of active infection with SARS-CoV-2.  Clinical  correlation with patient history and other diagnostic information is  necessary to determine patient infection status.  Positive results do  not rule out bacterial infection or co-infection with other viruses. If result is PRESUMPTIVE POSTIVE SARS-CoV-2 nucleic acids MAY BE PRESENT.   A presumptive positive result was obtained on the submitted specimen  and confirmed on repeat testing.  While 2019 novel coronavirus  (SARS-CoV-2) nucleic acids may be present in the submitted sample  additional confirmatory testing may be necessary for epidemiological   and / or clinical management purposes  to differentiate between  SARS-CoV-2 and other Sarbecovirus currently known to infect humans.  If clinically indicated additional testing with an alternate test  methodology [phone]) is advised. The SARS-CoV-2 RNA is generally  detectable in upper and lower respiratory sp ecimens during the acute  phase of infection. The expected result is Negative. Fact Sheet for Patients:  StrictlyIdeas.no Fact Sheet for Healthcare Providers: BankingDealers.co.za This test is not yet approved or cleared by the [place] FDA and has been authorized for detection and/or diagnosis of SARS-CoV-2 by FDA under an Emergency Use Authorization (EUA).  This EUA will remain in effect (meaning this test can be used) for the duration of the COVID-19 declaration under Section 564(b)(1) of the Act, 21 U.S.C. section 360bbb-3(b)(1), unless the authorization is terminated or revoked sooner. Performed at [facility], [address], [place], [place] [address]   Urine Drug Screen, [facility] only)     Status: Abnormal   Collection Time: 10/07/18  5:09 AM  Result Value Ref Range   Tricyclic, Ur Screen NONE DETECTED NONE DETECTED   Amphetamines, Ur Screen NONE DETECTED NONE DETECTED   MDMA (Ecstasy)Ur Screen NONE DETECTED NONE DETECTED   Cocaine Metabolite,Ur [place] NONE DETECTED NONE DETECTED   Opiate, Ur Screen NONE DETECTED NONE DETECTED   Phencyclidine (PCP) Ur S NONE DETECTED NONE DETECTED   Cannabinoid 50 Ng, Ur [place] POSITIVE (A) NONE DETECTED   Barbiturates, Ur Screen NONE DETECTED NONE DETECTED   Benzodiazepine, Ur Scrn NONE DETECTED NONE DETECTED   Methadone Scn, Ur NONE DETECTED NONE DETECTED    Comment: (NOTE) Tricyclics + metabolites, urine    Cutoff 1000 ng/mL Amphetamines + metabolites, urine  Cutoff 1000 ng/mL MDMA (Ecstasy), urine              Cutoff 500 ng/mL Cocaine Metabolite, urine           Cutoff 300 ng/mL Opiate + metabolites, urine        Cutoff 300 ng/mL Phencyclidine (PCP), urine         Cutoff 25 ng/mL Cannabinoid, urine                 Cutoff 50 ng/mL Barbiturates + metabolites, urine  Cutoff 200 ng/mL Benzodiazepine, urine              Cutoff 200 ng/mL Methadone, urine                   Cutoff 300 ng/mL The urine drug screen provides only a preliminary, unconfirmed analytical test result and should not be used for non-medical purposes. Clinical consideration and professional judgment should be applied to any positive drug screen result due to possible interfering substances. A more specific alternate chemical method must be used in order to obtain a confirmed analytical result. Gas chromatography / mass spectrometry (GC/MS) is the preferred confirmat ory method. Performed at [facility], [address] [place], [place] [address]   CBC     Status: Abnormal   Collection Time: 10/08/18  6:55 AM  Result Value Ref Range   WBC 18.9 (H) 4.0 - 10.5 K/uL   RBC 3.97 3.87 - 5.11 MIL/uL   Hemoglobin 11.3 (L) 12.0 - 15.0 g/dL   HCT 60.434.3 (L) 54.036.0 - 98.146.0 %   MCV 86.4 80.0 - 100.0 fL   MCH 28.5 26.0 - 34.0 pg   MCHC 32.9 30.0 - 36.0 g/dL   RDW 19.113.7 47.811.5 - 29.515.5 %   Platelets 289 150 - 400 K/uL   nRBC 0.0 0.0 - 0.2 %    Comment: Performed at [facility], [address] [place], [place] [address]    Assessment:   28 y.o. [phone] postpartum day # 1  Plan:    1) Acute blood loss anemia - hemodynamically stable and asymptomatic - po ferrous sulfate  2) O positive, Rubella Immune, Varicella Immune   3) TDAP status given antepartum  4) Feeding plan formula  5)  Education given regarding options for contraception, as well as compatibility with breast feeding if applicable.  Patient plans on Nexplanon for contraception.  6) Disposition: discharge to home pending newborn screening   Tresea MallJane Melvern Ramone, CNM [facility] 10/08/2018, 11:05 AM

## 2018-10-08 NOTE — Care Management (Signed)
Note copied from Newborns Chart RNCM consult for drug exposed newborn Patient positive for mariahuana.    RNCM met with mother, baby swaddled in bassinet at bedside.   Currently living in the home is mother, father, daughter who is 72, son who is 17, and patient that will be discharging today.  Baby has a follow up appointment at [facility], then mother states she will be transitioning to [facility].  Mother states that she has all necessities including car seat, crib, diapers, formula, and clothes.   Per nursing staff mother has been caring for patient appropriately.   Mother and patient were both positive for Mariajuana.  Mother does acknowledge that she smoke during the pregnancy for nausea, the last time 2 weeks ago. Mother states that she does smoke at home, however it is always outside and the children are not present.  Mother declines any substance abuse information  Mother notified that due to positive drug screen in patient, a CPS report will be filed.

## 2018-10-08 NOTE — Progress Notes (Signed)
Patient discharged home with infant. Discharge instructions, prescriptions and follow up appointment given to and reviewed with patient. Patient verbalized understanding. patient wheeled out by RN with infant

## 2018-10-12 ENCOUNTER — Encounter: Payer: Medicaid Other | Admitting: Obstetrics & Gynecology

## 2018-11-19 ENCOUNTER — Ambulatory Visit (INDEPENDENT_AMBULATORY_CARE_PROVIDER_SITE_OTHER): Payer: Medicaid Other | Admitting: Maternal Newborn

## 2018-11-19 ENCOUNTER — Encounter: Payer: Self-pay | Admitting: Maternal Newborn

## 2018-11-19 ENCOUNTER — Other Ambulatory Visit: Payer: Self-pay

## 2018-11-19 DIAGNOSIS — Z3046 Encounter for surveillance of implantable subdermal contraceptive: Secondary | ICD-10-CM | POA: Diagnosis not present

## 2018-11-19 DIAGNOSIS — Z30017 Encounter for initial prescription of implantable subdermal contraceptive: Secondary | ICD-10-CM

## 2018-11-19 DIAGNOSIS — Z1389 Encounter for screening for other disorder: Secondary | ICD-10-CM

## 2018-11-19 NOTE — Progress Notes (Signed)
Postpartum Visit  Chief Complaint:  Chief Complaint  Patient presents with  . Postpartum Care  . Contraception    Nexplanon insertion    History of Present Illness: Patient is a 28 y.o. [id number] presenting for a postpartum visit.  Date of delivery: 10/07/2018 Type of delivery: Vaginal delivery - Vacuum or forceps assisted  no Episiotomy No.  Laceration: no  Pregnancy or labor problems:  no Any problems since the delivery:  no  Newborn Details:  SINGLETON  Gender: Female. Birth weight: 7 lb, 2 oz  Maternal Details:  Breast Feeding:  no Post partum depression/anxiety noted:  no Edinburgh Post-Partum Depression Score:  6  Date of last PAP: 05/07/2018, normal   Review of Systems  Constitutional: Negative.   HENT: Negative.   Eyes: Negative.   Respiratory: Negative for shortness of breath and wheezing.   Cardiovascular: Negative for chest pain and palpitations.  Gastrointestinal: Negative for abdominal pain.  Genitourinary: Negative.   Musculoskeletal: Negative.   Skin: Positive for rash.  Neurological: Negative.   Endo/Heme/Allergies: Negative.   Psychiatric/Behavioral: Negative.   All other systems reviewed and are negative.   Past Medical History:  Past Medical History:  Diagnosis Date  . Tobacco use     Past Surgical History:  Past Surgical History:  Procedure Laterality Date  . NO PAST SURGERIES      Family History:  Family History  Problem Relation Age of Onset  . Breast cancer Maternal Grandmother [phone]  . COPD Mother   . Hypertension Father   . Diabetes Father     Social History:  Social History   Socioeconomic History  . Marital status: Married    Spouse name: Not on file  . Number of children: Not on file  . Years of education: Not on file  . Highest education level: Not on file  Occupational History  . Occupation: Unemployeed  Social Needs  . Financial resource strain: Not on file  . Food insecurity    Worry: Not on file   Inability: Not on file  . Transportation needs    Medical: Not on file    Non-medical: Not on file  Tobacco Use  . Smoking status: Current Every Day Smoker    Packs/day: 0.25    Types: Cigarettes  . Smokeless tobacco: Never Used  Substance and Sexual Activity  . Alcohol use: Not Currently  . Drug use: Yes    Types: Marijuana    Comment: 3-4 days ago   . Sexual activity: Yes    Birth control/protection: Implant  Lifestyle  . Physical activity    Days per week: Not on file    Minutes per session: Not on file  . Stress: Not on file  Relationships  . Social Musicianconnections    Talks on phone: Not on file    Gets together: Not on file    Attends religious service: Not on file    Active member of club or organization: Not on file    Attends meetings of clubs or organizations: Not on file    Relationship status: Not on file  . Intimate partner violence    Fear of current or ex partner: Not on file    Emotionally abused: Not on file    Physically abused: Not on file    Forced sexual activity: Not on file  Other Topics Concern  . Not on file  Social History Narrative  . Not on file    Allergies:  No Known  Allergies  Medications: Prior to Admission medications   Not on File    Physical Exam Vitals:  Vitals:   11/19/18 0936  BP: 124/60    General: NAD HEENT: normocephalic, anicteric Cardiovascular: RRR, no murmurs, rubs, or gallops Pulmonary: No increased work of breathing, CTAB Abdomen: Soft, non-tender, non-distended.  Umbilicus without lesions.  No hepatomegaly, splenomegaly or masses palpable. No evidence of hernia.  Genitourinary:  External: Normal external female genitalia.  Normal urethral  meatus, normal Bartholin's and Skene's glands.    Vagina: Normal vaginal mucosa, no evidence of prolapse.    Cervix: Grossly normal in appearance, menstrual blood  present  Uterus: Non-enlarged, mobile, normal contour.  No CMT  Adnexa: ovaries non-enlarged, no adnexal masses   Rectal: deferred Extremities: no edema, erythema, or tenderness Neurologic: Grossly intact Psychiatric: mood appropriate, affect full  Assessment: 27 y.o. [id number] presenting for a 6 week postpartum visit and Nexplanon insertion  Plan: Problem List Items Addressed This Visit      Other   Postpartum care following vaginal delivery - Primary    Other Visit Diagnoses    Insertion of Nexplanon          1) Contraception: Desires Nexplanon insertion today, see separate procedure note for details.  2)  Pap - ASCCP guidelines and rationale discussed.  Patient opts for every 3 year screening interval  3) Patient underwent screening for postpartum depression with no concerns noted. Score decreased from 12 at the hospital to 6 today and she is coping well. She has some occasional symptoms of low mood due to situational causes and is seeing a therapy group once weekly which helps.  4) Follow up 1 year for an annual exam.  Avel Sensor, CNM 11/19/2018  2:34 PM

## 2018-11-19 NOTE — Patient Instructions (Signed)
Nexplanon Instructions After Insertion  Keep bandage clean and dry for 24 hours  May use ice/Tylenol/Ibuprofen for soreness or pain  If you develop fever, drainage or increased warmth from incision site-contact office immediately   

## 2018-11-19 NOTE — Progress Notes (Signed)
   GYNECOLOGY PROCEDURE NOTE  Patient is a 28 y.o. [id number] presenting for Nexplanon insertion as her desired means of contraception.  She provided informed consent, signed copy in the chart, time out was performed. Patient's last menstrual period was 11/15/2018 (exact date). Currently on cycle.  She understands that Nexplanon is a progesterone only therapy, and that patients often patients have irregular and unpredictable vaginal bleeding or amenorrhea. She understands that other side effects are possible related to systemic progesterone, including but not limited to, headaches, breast tenderness, nausea, and irritability. The placement procedure for Nexplanon was reviewed with the patient in detail including risks of nerve injury, infection, bleeding and injury to other muscles or tendons. She understands that the Nexplanon implant is good for 3 years and needs to be removed at the end of that time.  She understands that Nexplanon is an extremely effective option for contraception, with failure rate of <1%. This information is reviewed today and all questions were answered. Informed consent was obtained, both verbally and written. The patient is healthy and has no contraindications to Nexplanon use.   Procedure Appropriate time out taken.  Patient placed in dorsal supine with left arm above head, elbow flexed at 90 degrees, arm resting on examination table.  The bicipital grove was palpated and site 8-10 cm proximal to the medial epicondyle was indentified. The insertion site was prepped with a two betadine swabs and then injected with 1 cc of 1% lidocaine without epinephrine.  Nexplanon removed form sterile blister packaging,  Device confirmed in needle, before inserting full length of needle, tenting up the skin as the needle was advanced.  The drug eluting rod was then deployed by pulling back the slider per the manufacturer's recommendation.  The implant was palpable by the clinician as well as the  patient.  The insertion site was hemostatic and covered with Steristrips, then dressed with a Band-aid before applying  a Kerlex bandage pressure dressing. Minimal blood loss was noted during the procedure.  The patient tolerated the procedure well.   She was instructed to wear the bandage for 24 hours, call with any signs of infection.  She was given the Nexplanon card and instructed to have the rod removed in 3 years.  Avel Sensor, CNM 11/19/2018  10:17 AM

## 2019-03-29 NOTE — Progress Notes (Deleted)
Patient, No Pcp Per   No chief complaint on file.   HPI:      [name] is a 29 y.o. [phone] who LMP was No LMP recorded., presents today for ***  PP 7/20 with vaginal delivery; has nexplanon placed 8/20  Patient Active Problem List   Diagnosis Date Noted  . Postpartum care following vaginal delivery 10/08/2018  . Normal labor 10/07/2018  . Supervision of other normal pregnancy, antepartum 05/07/2018  . Late prenatal care 05/07/2018    Past Surgical History:  Procedure Laterality Date  . NO PAST SURGERIES      Family History  Problem Relation Age of Onset  . Breast cancer Maternal Grandmother 31  . COPD Mother   . Hypertension Father   . Diabetes Father     Social History   Socioeconomic History  . Marital status: Married    Spouse name: Not on file  . Number of children: Not on file  . Years of education: Not on file  . Highest education level: Not on file  Occupational History  . Occupation: Unemployeed  Tobacco Use  . Smoking status: Current Every Day Smoker    Packs/day: 0.25    Types: Cigarettes  . Smokeless tobacco: Never Used  Substance and Sexual Activity  . Alcohol use: Not Currently  . Drug use: Yes    Types: Marijuana    Comment: 3-4 days ago   . Sexual activity: Yes    Birth control/protection: Implant  Other Topics Concern  . Not on file  Social History Narrative  . Not on file   Social Determinants of Health   Financial Resource Strain:   . Difficulty of Paying Living Expenses: Not on file  Food Insecurity:   . Worried About Programme researcher, broadcasting/film/video in the Last Year: Not on file  . Ran Out of Food in the Last Year: Not on file  Transportation Needs:   . Lack of Transportation (Medical): Not on file  . Lack of Transportation (Non-Medical): Not on file  Physical Activity:   . Days of Exercise per Week: Not on file  . Minutes of Exercise per Session: Not on file  Stress:   . Feeling of Stress : Not on file  Social  Connections:   . Frequency of Communication with Friends and Family: Not on file  . Frequency of Social Gatherings with Friends and Family: Not on file  . Attends Religious Services: Not on file  . Active Member of Clubs or Organizations: Not on file  . Attends Banker Meetings: Not on file  . Marital Status: Not on file  Intimate Partner Violence:   . Fear of Current or Ex-Partner: Not on file  . Emotionally Abused: Not on file  . Physically Abused: Not on file  . Sexually Abused: Not on file    Outpatient Medications Prior to Visit  Medication Sig Dispense Refill  . etonogestrel (NEXPLANON) 68 MG IMPL implant 1 each by Subdermal route once.     No facility-administered medications prior to visit.      ROS:  Review of Systems BREAST: No symptoms   OBJECTIVE:   Vitals:  There were no vitals taken for this visit.  Physical Exam  Results: No results found for this or any previous visit (from the past 24 hour(s)).   Assessment/Plan: No diagnosis found.    No orders of the defined types were placed in this encounter.     No follow-ups  on file.  Loukas Antonson B. Aleli Navedo, PA-C 03/29/2019 4:50 PM

## 2019-03-31 ENCOUNTER — Ambulatory Visit: Payer: Medicaid Other | Admitting: Obstetrics and Gynecology

## 2020-02-23 NOTE — Progress Notes (Deleted)
    Patient, No Pcp Per   No chief complaint on file.   HPI:      Joy Harrell is a 29 y.o. [phone] whose LMP was No LMP recorded., presents today for ***  1 yr PP  Past Medical History:  Diagnosis Date  . Tobacco use     Past Surgical History:  Procedure Laterality Date  . NO PAST SURGERIES      Family History  Problem Relation Age of Onset  . Breast cancer Maternal Grandmother 24  . COPD Mother   . Hypertension Father   . Diabetes Father     Social History   Socioeconomic History  . Marital status: Married    Spouse name: Not on file  . Number of children: Not on file  . Years of education: Not on file  . Highest education level: Not on file  Occupational History  . Occupation: Unemployeed  Tobacco Use  . Smoking status: Current Every Day Smoker    Packs/day: 0.25    Types: Cigarettes  . Smokeless tobacco: Never Used  Vaping Use  . Vaping Use: Never used  Substance and Sexual Activity  . Alcohol use: Not Currently  . Drug use: Yes    Types: Marijuana    Comment: 3-4 days ago   . Sexual activity: Yes    Birth control/protection: Implant  Other Topics Concern  . Not on file  Social History Narrative  . Not on file   Social Determinants of Health   Financial Resource Strain:   . Difficulty of Paying Living Expenses: Not on file  Food Insecurity:   . Worried About Programme researcher, broadcasting/film/video in the Last Year: Not on file  . Ran Out of Food in the Last Year: Not on file  Transportation Needs:   . Lack of Transportation (Medical): Not on file  . Lack of Transportation (Non-Medical): Not on file  Physical Activity:   . Days of Exercise per Week: Not on file  . Minutes of Exercise per Session: Not on file  Stress:   . Feeling of Stress : Not on file  Social Connections:   . Frequency of Communication with Friends and Family: Not on file  . Frequency of Social Gatherings with Friends and Family: Not on file  . Attends Religious Services: Not on  file  . Active Member of Clubs or Organizations: Not on file  . Attends Banker Meetings: Not on file  . Marital Status: Not on file  Intimate Partner Violence:   . Fear of Current or Ex-Partner: Not on file  . Emotionally Abused: Not on file  . Physically Abused: Not on file  . Sexually Abused: Not on file    Outpatient Medications Prior to Visit  Medication Sig Dispense Refill  . etonogestrel (NEXPLANON) 68 MG IMPL implant 1 each by Subdermal route once.     No facility-administered medications prior to visit.      ROS:  Review of Systems BREAST: No symptoms   OBJECTIVE:   Vitals:  There were no vitals taken for this visit.  Physical Exam  Results: No results found for this or any previous visit (from the past 24 hour(s)).   Assessment/Plan: No diagnosis found.    No orders of the defined types were placed in this encounter.     No follow-ups on file.  Makeisha Jentsch B. Essynce Munsch, PA-C 02/23/2020 4:35 PM

## 2020-02-27 ENCOUNTER — Ambulatory Visit: Payer: Medicaid Other | Admitting: Obstetrics and Gynecology

## 2021-06-04 ENCOUNTER — Telehealth: Payer: Self-pay | Admitting: Family Medicine

## 2021-06-04 NOTE — Telephone Encounter (Signed)
Pt is scheduled with [facility] for Nexplanon removal and reinsertion on 3/27. ?

## 2021-06-07 NOTE — Telephone Encounter (Signed)
Noted. Will order to arrive by apt date/time. 

## 2021-06-17 ENCOUNTER — Ambulatory Visit: Payer: Medicaid Other | Admitting: Family Medicine

## 2021-06-17 NOTE — Telephone Encounter (Signed)
Patient rescheduled for 07/16/21 at 3:15 with [facility]

## 2021-06-17 NOTE — Telephone Encounter (Signed)
Noted  

## 2021-07-16 ENCOUNTER — Ambulatory Visit: Payer: Medicaid Other | Admitting: Family Medicine

## 2021-07-16 NOTE — Telephone Encounter (Signed)
Patient is scheduled for 08/13/21 with [facility] at 1:15 pm

## 2021-07-18 NOTE — Telephone Encounter (Signed)
Noted  

## 2021-08-12 NOTE — Progress Notes (Unsigned)
PCP:  Patient, No Pcp Per (Inactive)   No chief complaint on file.    HPI:      [name] is a 31 y.o. [phone] whose LMP was No LMP recorded., presents today for her annual examination.  Her menses are {norm/abn:715}, lasting {number: 22536} days.  Dysmenorrhea {dysmen:716}. She {does:18564} have intermenstrual bleeding.  Sex activity: {sex active: 315163}. Nexplanon placed 11/19/18, would like replacement Last Pap: 05/07/18 Results were: no abnormalities /neg HPV DNA  Hx of STDs: {STD hx:14358}  There is no FH of breast cancer. There is no FH of ovarian cancer. The patient {does:18564} do self-breast exams.  Tobacco use: {tob:20664} Alcohol use: {Alcohol:11675} No drug use.  Exercise: {exercise:31265}  She {does:18564} get adequate calcium and Vitamin D in her diet.  Patient Active Problem List   Diagnosis Date Noted   Postpartum care following vaginal delivery 10/08/2018   Normal labor 10/07/2018   Supervision of other normal pregnancy, antepartum 05/07/2018   Late prenatal care 05/07/2018    Past Surgical History:  Procedure Laterality Date   NO PAST SURGERIES      Family History  Problem Relation Age of Onset   Breast cancer Maternal Grandmother 28   COPD Mother    Hypertension Father    Diabetes Father     Social History   Socioeconomic History   Marital status: Married    Spouse name: Not on file   Number of children: Not on file   Years of education: Not on file   Highest education level: Not on file  Occupational History   Occupation: Unemployeed  Tobacco Use   Smoking status: Every Day    Packs/day: 0.25    Types: Cigarettes   Smokeless tobacco: Never  Vaping Use   Vaping Use: Never used  Substance and Sexual Activity   Alcohol use: Not Currently   Drug use: Yes    Types: Marijuana    Comment: 3-4 days ago    Sexual activity: Yes    Birth control/protection: Implant  Other Topics Concern   Not on file  Social History  Narrative   Not on file   Social Determinants of Health   Financial Resource Strain: Not on file  Food Insecurity: Not on file  Transportation Needs: Not on file  Physical Activity: Not on file  Stress: Not on file  Social Connections: Not on file  Intimate Partner Violence: Not on file     Current Outpatient Medications:    etonogestrel (NEXPLANON) 68 MG IMPL implant, 1 each by Subdermal route once., Disp: , Rfl:      ROS:  Review of Systems BREAST: No symptoms   Objective: There were no vitals taken for this visit.   OBGyn Exam  Results: No results found for this or any previous visit (from the past 24 hour(s)).  Nexplanon removal Procedure note - The Nexplanon was noted in the patient's arm and the end was identified. The skin was cleansed with a Betadine solution. A small injection of subcutaneous lidocaine with epinephrine was given over the end of the implant. An incision was made at the end of the implant. The rod was noted in the incision and grasped with a hemostat. It was noted to be intact.  Steri-Strip was placed approximating the incision. Hemostasis was noted.  Nexplanon Insertion  Patient given informed consent, signed copy in the chart, time out was performed. Pregnancy test was ***. Appropriate time out taken.  Patient's LEFT/RIGHT *** arm was  prepped and draped in the usual sterile fashion. The ruler used to measure and mark insertion area.  Pt was prepped with betadine swab and then injected with *** cc of 2% lidocaine with epinephrine. Nexplanon removed form packaging,  Device confirmed in needle, then inserted full length of needle and withdrawn per handbook instructions.  Pt insertion site covered with steri-strip and a bandage.   Minimal blood loss.  Pt tolerated the procedure well.   Assessment/Plan: No diagnosis found.  No orders of the defined types were placed in this encounter.          She was told to remove the dressing in 12-24 hours,  to keep the incision area dry for 24 hours and to remove the Steristrip in 2-3  days.  Notify us if any signs of tenderness, redness, pain, or fevers develop.   GYN counsel {counseling: 16159}     F/U  No follow-ups on file.  Analeia Ismael B. Yenesis Even, PA-C 08/12/2021 12:20 PM

## 2021-08-13 ENCOUNTER — Ambulatory Visit: Payer: Medicaid Other | Admitting: Obstetrics and Gynecology

## 2021-08-13 DIAGNOSIS — Z124 Encounter for screening for malignant neoplasm of cervix: Secondary | ICD-10-CM

## 2021-08-13 DIAGNOSIS — Z01419 Encounter for gynecological examination (general) (routine) without abnormal findings: Secondary | ICD-10-CM

## 2021-08-13 DIAGNOSIS — Z3046 Encounter for surveillance of implantable subdermal contraceptive: Secondary | ICD-10-CM

## 2021-10-08 ENCOUNTER — Telehealth: Payer: Self-pay | Admitting: Advanced Practice Midwife

## 2021-10-08 NOTE — Telephone Encounter (Signed)
Patient coming in on 10/22/2021 at 3:35 with Joy Harrell for nexplanon removal and reinsertion.

## 2021-10-11 NOTE — Telephone Encounter (Signed)
Noted. Will order to arrive by apt date/time. 

## 2021-10-22 ENCOUNTER — Ambulatory Visit (INDEPENDENT_AMBULATORY_CARE_PROVIDER_SITE_OTHER): Payer: Medicaid Other | Admitting: Advanced Practice Midwife

## 2021-10-22 VITALS — BP 126/78 | Ht 68.0 in | Wt 179.0 lb

## 2021-10-22 DIAGNOSIS — Z3046 Encounter for surveillance of implantable subdermal contraceptive: Secondary | ICD-10-CM

## 2021-10-23 ENCOUNTER — Encounter: Payer: Self-pay | Admitting: Advanced Practice Midwife

## 2021-10-23 NOTE — Progress Notes (Signed)
   GYNECOLOGY PROCEDURE NOTE Patient is here for Nexplanon removal and reinsertion due to expiration of current device.   Nexplanon removal discussed in detail.  Risks of infection, bleeding, nerve injury all reviewed.  Patient understands risks and desires to proceed.  Verbal consent obtained.  Patient is certain she wants the Nexplanon removed.  All questions answered. She has irregular light bleeding with Nexplanon.  Review of Systems  Constitutional:  Negative for chills and fever.  HENT:  Negative for congestion, ear discharge, ear pain, hearing loss, sinus pain and sore throat.   Eyes:  Negative for blurred vision and double vision.  Respiratory:  Negative for cough, shortness of breath and wheezing.   Cardiovascular:  Negative for chest pain, palpitations and leg swelling.  Gastrointestinal:  Negative for abdominal pain, blood in stool, constipation, diarrhea, heartburn, melena, nausea and vomiting.  Genitourinary:  Negative for dysuria, flank pain, frequency, hematuria and urgency.  Musculoskeletal:  Negative for back pain, joint pain and myalgias.  Skin:  Negative for itching and rash.  Neurological:  Negative for dizziness, tingling, tremors, sensory change, speech change, focal weakness, seizures, loss of consciousness, weakness and headaches.  Endo/Heme/Allergies:  Negative for environmental allergies. Does not bruise/bleed easily.  Psychiatric/Behavioral:  Negative for depression, hallucinations, memory loss, substance abuse and suicidal ideas. The patient is not nervous/anxious and does not have insomnia.        Positive for anxiety   Vital Signs: BP 126/78   Ht 5\' 8"  (1.727 m)   Wt 179 lb (81.2 kg)   BMI 27.22 kg/m  Constitutional: Well nourished, well developed female in no acute distress.  HEENT: normal Skin: Warm and dry.   Extremity:  no edema   Respiratory: Clear to auscultation bilateral. Normal respiratory effort Neuro: DTRs 2+, Cranial nerves grossly  intact Psych: Alert and Oriented x3. No memory deficits. Mild anxiousness   Procedure: Patient placed in dorsal supine with left arm above head, elbow flexed at 90 degrees, arm resting on examination table.  Proximal end of Nexplanon identified without problems. Distal end noted to be deep and not presenting when pressure applied to proximal end. Patient still desires to proceed. Betadine scrub x3.  2 ml of 1% lidocaine injected under Nexplanon device without problems.  Sterile gloves applied.  Small 0.5cm incision made at distal tip of Nexplanon device with 11 blade scalpel. Unable to remove device after approximately 5 minutes of attempting. The patient began to feel lightheaded with sweating and requests discontinuation of attempt. Procedure discontinued, cold cloth applied to forehead, water given to patient. After recovery, offered to have a different provider attempt removal of Nexplanon. She is agreeable. Alicia Copland PA also attempted removal unsuccessfully.  Pressure applied to incision.  Hemostasis obtained.  Steri-strips applied, followed by bandage and compression dressing.  Patient is in good/stable condition when leaving and she knows to reschedule procedure.   Assessment: 31 y.o. year old female now s/p uncomplicated Nexplanon removal.  Plan: 1.  Patient given post procedure precautions and asked to call for fever, chills, redness or drainage from her incision, bleeding from incision.  She understands she will likely have a small bruise near site of removal and can remove bandage tomorrow and steri-strips in approximately 1 week.  2) Contraception: reschedule procedure   37, CNM [facility] 10/23/21, 12:06 PM    J2001 for lidocaine block, 11982 for nexplanon removal

## 2021-11-20 NOTE — Progress Notes (Unsigned)
   No chief complaint on file.    History of Present Illness:  Joy Harrell is a 31 y.o. that had a nexplanon placed approximately {NUMBERS:20191} {MONTH/YR:310907}  ago. Since that time, she  ***.  There were no vitals taken for this visit.   Nexplanon removal Procedure note - The Nexplanon was noted in the patient's arm and the end was identified. The skin was cleansed with a Betadine solution. A small injection of subcutaneous lidocaine with epinephrine was given over the end of the implant. An incision was made at the end of the implant. The rod was noted in the incision and grasped with a hemostat. It was noted to be intact.  Steri-Strip was placed approximating the incision. Hemostasis was noted.  Assessment: Nexplanon removal   Plan:   She was told to remove the dressing in 12-24 hours, to keep the incision area dry for 24 hours and to remove the Steristrip in 2-3  days.  Notify us if any signs of tenderness, redness, pain, or fevers develop.   Artisha Capri B. Whitnie Deleon, PA-C 11/20/2021 7:32 PM   No chief complaint on file.    HPI:  Joy Harrell is a 31 y.o. [phone] here for Nexplanon insertion. No GYN concerns.  Last pap smear was normal.  There were no vitals taken for this visit.   Nexplanon Insertion  Patient given informed consent, signed copy in the chart, time out was performed. Pregnancy test was ***. Appropriate time out taken.  Patient's LEFT/RIGHT *** arm was prepped and draped in the usual sterile fashion. The ruler used to measure and mark insertion area.  Pt was prepped with betadine swab and then injected with *** cc of 2% lidocaine with epinephrine. Nexplanon removed form packaging,  Device confirmed in needle, then inserted full length of needle and withdrawn per handbook instructions.  Pt insertion site covered with steri-strip and a bandage.   Minimal blood loss.  Pt tolerated the procedure welL.  Assessment: No diagnosis found.   No orders of  the defined types were placed in this encounter.    Plan:   She was told to remove the dressing in 12-24 hours, to keep the incision area dry for 24 hours and to remove the Steristrip in 2-3  days.  Notify us if any signs of tenderness, redness, pain, or fevers develop.   Tasfia Vasseur B. Amato Sevillano, PA-C 11/20/2021 7:32 PM

## 2021-11-21 ENCOUNTER — Other Ambulatory Visit (HOSPITAL_COMMUNITY)
Admission: RE | Admit: 2021-11-21 | Discharge: 2021-11-21 | Disposition: A | Discharge status: Home / Self Care | Payer: Medicaid Other | Source: Ambulatory Visit | Attending: Obstetrics and Gynecology | Admitting: Obstetrics and Gynecology

## 2021-11-21 ENCOUNTER — Encounter: Payer: Self-pay | Admitting: Obstetrics and Gynecology

## 2021-11-21 ENCOUNTER — Ambulatory Visit (INDEPENDENT_AMBULATORY_CARE_PROVIDER_SITE_OTHER): Payer: Medicaid Other | Admitting: Obstetrics and Gynecology

## 2021-11-21 VITALS — BP 120/60 | Ht 68.0 in | Wt 182.0 lb

## 2021-11-21 DIAGNOSIS — Z3046 Encounter for surveillance of implantable subdermal contraceptive: Secondary | ICD-10-CM | POA: Diagnosis not present

## 2021-11-21 DIAGNOSIS — Z124 Encounter for screening for malignant neoplasm of cervix: Secondary | ICD-10-CM

## 2021-11-21 LAB — POCT URINE PREGNANCY: Preg Test, Ur: NEGATIVE

## 2021-11-21 MED ORDER — ETONOGESTREL 68 MG ~~LOC~~ IMPL
68.0000 mg | DRUG_IMPLANT | Freq: Once | SUBCUTANEOUS | Status: AC
  Administered 2021-11-21: 68 mg via SUBCUTANEOUS

## 2021-11-21 NOTE — Patient Instructions (Signed)
I value your feedback and you entrusting us with your care. If you get a [facility] patient survey, I would appreciate you taking the time to let us know about your experience today. Thank you!  Remove the dressing in 24 hours,  keep the incision area dry for 24 hours and remove the Steristrip in 2-3  days.  Notify us if any signs of tenderness, redness, pain, or fevers develop.   

## 2021-12-02 LAB — CYTOLOGY - PAP
Comment: NEGATIVE
Diagnosis: UNDETERMINED — AB
High risk HPV: NEGATIVE

## 2021-12-03 ENCOUNTER — Encounter: Payer: Self-pay | Admitting: Obstetrics and Gynecology

## 2023-06-01 NOTE — Progress Notes (Deleted)
   PCP:  Patient, No Pcp Per   No chief complaint on file.    HPI:      [name] is a 33 y.o. [phone] whose LMP was No LMP recorded. Patient has had an implant., presents today for her annual examination.  Her menses are {norm/abn:715}, lasting {number: 22536} days.  Dysmenorrhea {dysmen:716}. She {does:18564} have intermenstrual bleeding.  Sex activity: {sex active: 315163}. Nexplanon replaced 11/21/21 Last Pap: 11/21/21  Results were: ASCUS with NEGATIVE high risk HPV ; repeat pap due  There is no FH of breast cancer. There is no FH of ovarian cancer. The patient {does:18564} do self-breast exams.  Tobacco use: {tob:20664} Alcohol use: {Alcohol:11675} No drug use.  Exercise: {exercise:31265}  She {does:18564} get adequate calcium and Vitamin D in her diet.  There are no active problems to display for this patient.   Past Surgical History:  Procedure Laterality Date   NO PAST SURGERIES      Family History  Problem Relation Age of Onset   Breast cancer Maternal Grandmother 64   COPD Mother    Hypertension Father    Diabetes Father     Social History   Socioeconomic History   Marital status: Married    Spouse name: Not on file   Number of children: Not on file   Years of education: Not on file   Highest education level: Not on file  Occupational History   Occupation: Unemployeed  Tobacco Use   Smoking status: Every Day    Current packs/day: 0.25    Types: Cigarettes   Smokeless tobacco: Never  Vaping Use   Vaping status: Never Used  Substance and Sexual Activity   Alcohol use: Not Currently   Drug use: Yes    Types: Marijuana    Comment: 3-4 days ago    Sexual activity: Yes    Birth control/protection: Implant  Other Topics Concern   Not on file  Social History Narrative   Not on file   Social Drivers of Health   Financial Resource Strain: Not on file  Food Insecurity: Not on file  Transportation Needs: Not on file  Physical Activity:  Not on file  Stress: Not on file  Social Connections: Not on file  Intimate Partner Violence: Not on file     Current Outpatient Medications:    busPIRone (BUSPAR) 10 MG tablet, Take 10 mg by mouth 2 (two) times daily., Disp: , Rfl:    etonogestrel (NEXPLANON) 68 MG IMPL implant, 1 each by Subdermal route once., Disp: , Rfl:    etonogestrel (NEXPLANON) 68 MG IMPL implant, 1 each by Subdermal route once. Left Arm, Disp: , Rfl:    SUBOXONE 8-2 MG FILM, Place under the tongue 2 (two) times daily., Disp: , Rfl:      ROS:  Review of Systems BREAST: No symptoms   Objective: There were no vitals taken for this visit.   OBGyn Exam  Results: No results found for this or any previous visit (from the past 24 hours).  Assessment/Plan: No diagnosis found.  No orders of the defined types were placed in this encounter.            GYN counsel {counseling: 16159}     F/U  No follow-ups on file.  Joy Harrell B. Nikola Blackston, PA-C 06/01/2023 12:52 PM

## 2023-06-02 ENCOUNTER — Ambulatory Visit: Payer: Medicaid Other | Admitting: Obstetrics and Gynecology

## 2023-06-02 DIAGNOSIS — Z124 Encounter for screening for malignant neoplasm of cervix: Secondary | ICD-10-CM

## 2023-06-02 DIAGNOSIS — Z01419 Encounter for gynecological examination (general) (routine) without abnormal findings: Secondary | ICD-10-CM

## 2023-06-02 DIAGNOSIS — Z3046 Encounter for surveillance of implantable subdermal contraceptive: Secondary | ICD-10-CM

## 2023-06-02 DIAGNOSIS — R87610 Atypical squamous cells of undetermined significance on cytologic smear of cervix (ASC-US): Secondary | ICD-10-CM

## 2023-06-02 DIAGNOSIS — Z1151 Encounter for screening for human papillomavirus (HPV): Secondary | ICD-10-CM

## 2023-06-03 ENCOUNTER — Encounter: Payer: Self-pay | Admitting: Obstetrics and Gynecology

## 2023-07-20 NOTE — Progress Notes (Addendum)
 PCP:  Patient, No Pcp Per   Chief Complaint  Patient presents with   Annual Exam    Discuss birth control change     HPI:      [name] is a 33 y.o. [id number] whose LMP was Patient's last menstrual period was 06/23/2023 (approximate)., presents today for her annual examination.  Her menses are regular every 28-30 days, lasting 4-5 days, light to mod flow with nexplanon ; then has frequent BTB/sptting randomly.  Mild dysmen with menses, no BTB, improved with NSAIDs. No hx of HTN, DVTs, migraines with aura.   Sex activity: single partner, contraception -  Nexplanon  placed 11/21/21. No pain/bleeding/dryness.  Last Pap: 11/21/21 Results were: ASCUS with NEGATIVE high risk HPV ; repeat pap today.  There is a FH of breast cancer in her MGM, genetic testing not done by pt, but her 1/2 sister on maternal side is BRCA neg.  There is no FH of ovarian cancer. 3 uncles with colon cancer on pat side. The patient does do self-breast exams. Feels area LT breast that is different, more prominent in upright position.   Tobacco use: 1/2-3/4 ppd Alcohol use: none Few times wkly MJ use.  Exercise: not active  She does not get adequate calcium and Vitamin D in her diet.  Past Medical History:  Diagnosis Date   BRCA negative 10/2023   MyRisk neg   Family history of breast cancer 10/2023   IBIS=15.1%/riskscore=16.4%   Tobacco use     Past Surgical History:  Procedure Laterality Date   NO PAST SURGERIES      Family History  Problem Relation Age of Onset   COPD Mother    Hypertension Father    Diabetes Father    Other Sister        1/2 mat sister; BRCA neg   Breast cancer Maternal Grandmother 19   Colon cancer Paternal Uncle 59   Colon cancer Paternal Uncle 61   Colon cancer Paternal Uncle 51    Social History   Socioeconomic History   Marital status: Married    Spouse name: Not on file   Number of children: Not on file   Years of education: Not on file   Highest  education level: Not on file  Occupational History   Occupation: Unemployeed  Tobacco Use   Smoking status: Every Day    Current packs/day: 0.25    Types: Cigarettes   Smokeless tobacco: Never  Vaping Use   Vaping status: Never Used  Substance and Sexual Activity   Alcohol use: Not Currently   Drug use: Yes    Types: Marijuana    Comment: 3-4 days ago    Sexual activity: Yes    Birth control/protection: Implant  Other Topics Concern   Not on file  Social History Narrative   Not on file   Social Drivers of Health   Financial Resource Strain: Not on file  Food Insecurity: Not on file  Transportation Needs: Not on file  Physical Activity: Not on file  Stress: Not on file  Social Connections: Not on file  Intimate Partner Violence: Not on file     Current Outpatient Medications:    etonogestrel  (NEXPLANON ) 68 MG IMPL implant, 1 each by Subdermal route once. Left Arm, Disp: , Rfl:    norethindrone  (MICRONOR ) 0.35 MG tablet, Take 1 tablet (0.35 mg total) by mouth daily., Disp: 84 tablet, Rfl: 0   SUBOXONE 8-2 MG FILM, Place under the tongue 2 (two) times  daily., Disp: , Rfl:    busPIRone (BUSPAR) 10 MG tablet, Take 10 mg by mouth 2 (two) times daily. (Patient not taking: Reported on 07/21/2023), Disp: , Rfl:      ROS:  Review of Systems  Constitutional:  Negative for fatigue, fever and unexpected weight change.  Respiratory:  Negative for cough, shortness of breath and wheezing.   Cardiovascular:  Negative for chest pain, palpitations and leg swelling.  Gastrointestinal:  Negative for blood in stool, constipation, diarrhea, nausea and vomiting.  Endocrine: Negative for cold intolerance, heat intolerance and polyuria.  Genitourinary:  Positive for vaginal bleeding. Negative for dyspareunia, dysuria, flank pain, frequency, genital sores, hematuria, menstrual problem, pelvic pain, urgency, vaginal discharge and vaginal pain.  Musculoskeletal:  Positive for arthralgias.  Negative for back pain, joint swelling and myalgias.  Skin:  Negative for rash.  Neurological:  Positive for headaches. Negative for dizziness, syncope, light-headedness and numbness.  Hematological:  Negative for adenopathy.  Psychiatric/Behavioral:  Positive for agitation. Negative for confusion, sleep disturbance and suicidal ideas. The patient is not nervous/anxious.    BREAST: No symptoms   Objective: BP 118/78   Ht 5' 8 (1.727 m)   Wt 179 lb 8.5 oz (81.4 kg)   LMP 06/23/2023 (Approximate)   BMI 27.30 kg/m    Physical Exam Constitutional:      Appearance: She is well-developed.  Genitourinary:     Vulva normal.     Right Labia: No rash, tenderness or lesions.    Left Labia: No tenderness, lesions or rash.    Vaginal bleeding present.     No vaginal discharge, erythema or tenderness.      Right Adnexa: not tender and no mass present.    Left Adnexa: not tender and no mass present.    No cervical friability or polyp.     Uterus is not enlarged or tender.  Breasts:    Right: No mass, nipple discharge, skin change or tenderness.     Left: Mass present. No nipple discharge, skin change or tenderness.  Neck:     Thyroid: No thyromegaly.  Cardiovascular:     Rate and Rhythm: Normal rate and regular rhythm.     Heart sounds: Normal heart sounds. No murmur heard. Pulmonary:     Effort: Pulmonary effort is normal.     Breath sounds: Normal breath sounds.  Chest:    Abdominal:     Palpations: Abdomen is soft.     Tenderness: There is no abdominal tenderness. There is no guarding or rebound.  Musculoskeletal:        General: Normal range of motion.     Cervical back: Normal range of motion.  Lymphadenopathy:     Cervical: No cervical adenopathy.  Neurological:     General: No focal deficit present.     Mental Status: She is alert and oriented to person, place, and time.     Cranial Nerves: No cranial nerve deficit.  Skin:    General: Skin is warm and dry.   Psychiatric:        Mood and Affect: Mood normal.        Behavior: Behavior normal.        Thought Content: Thought content normal.        Judgment: Judgment normal.  Vitals reviewed.    Assessment/Plan: Encounter for annual routine gynecological examination  Cervical cancer screening - Plan: Cytology - PAP  Screening for HPV (human papillomavirus) - Plan: Cytology - PAP  ASCUS of cervix  with negative high risk HPV - Plan: Cytology - PAP; repeat pap today. Will f/u if abn.   Encounter for surveillance of implantable subdermal contraceptive--has nexplanon , due for replacement after 3 yrs  Encounter for initial prescription of contraceptive pills - Plan: norethindrone  (MICRONOR ) 0.35 MG tablet; frequent BTB with nexplanon , start POPs, Rx eRxd. Discussed other BC options including OCPs, patch, nuvaring, depo, IUD. Pt to consider and f/u prn.   Mass of upper inner quadrant of left breast - Plan: MM 3D DIAGNOSTIC MAMMOGRAM BILATERAL BREAST, US  LIMITED ULTRASOUND INCLUDING AXILLA LEFT BREAST , US  LIMITED ULTRASOUND INCLUDING AXILLA RIGHT BREAST; pt to schedule dx mammo and u/s. If WNL, most likely prominent tissue.   Family history of breast cancer - Plan: MM 3D DIAGNOSTIC MAMMOGRAM BILATERAL BREAST, US  LIMITED ULTRASOUND INCLUDING AXILLA LEFT BREAST , US  LIMITED ULTRASOUND INCLUDING AXILLA RIGHT BREAST, Integrated BRACAnalysis ([facility]); MyRisk testing discussed and done today. Her mat 1/2 sister is BRCA pos, pt to clarify type of gene. Will f/u with results.    Meds ordered this encounter  Medications   norethindrone  (MICRONOR ) 0.35 MG tablet    Sig: Take 1 tablet (0.35 mg total) by mouth daily.    Dispense:  84 tablet    Refill:  0    Supervising Provider:   ROBY, MICIA [[id number]]             GYN counsel breast self exam, adequate intake of calcium and vitamin D, diet and exercise, tob and MJ cessation     F/U  Return in about 1 year (around  07/20/2024).  Isaack Preble B. Dodger Sinning, PA-C 11/18/2023 2:10 PM

## 2023-07-21 ENCOUNTER — Ambulatory Visit (INDEPENDENT_AMBULATORY_CARE_PROVIDER_SITE_OTHER): Admitting: Obstetrics and Gynecology

## 2023-07-21 ENCOUNTER — Other Ambulatory Visit (HOSPITAL_COMMUNITY)
Admission: RE | Admit: 2023-07-21 | Discharge: 2023-07-21 | Disposition: A | Discharge status: Home / Self Care | Source: Ambulatory Visit | Attending: Obstetrics and Gynecology | Admitting: Obstetrics and Gynecology

## 2023-07-21 ENCOUNTER — Encounter: Payer: Self-pay | Admitting: Obstetrics and Gynecology

## 2023-07-21 VITALS — BP 118/78 | Ht 68.0 in | Wt 179.5 lb

## 2023-07-21 DIAGNOSIS — Z1151 Encounter for screening for human papillomavirus (HPV): Secondary | ICD-10-CM

## 2023-07-21 DIAGNOSIS — R87610 Atypical squamous cells of undetermined significance on cytologic smear of cervix (ASC-US): Secondary | ICD-10-CM

## 2023-07-21 DIAGNOSIS — Z124 Encounter for screening for malignant neoplasm of cervix: Secondary | ICD-10-CM | POA: Diagnosis present

## 2023-07-21 DIAGNOSIS — Z01419 Encounter for gynecological examination (general) (routine) without abnormal findings: Secondary | ICD-10-CM | POA: Diagnosis not present

## 2023-07-21 DIAGNOSIS — Z803 Family history of malignant neoplasm of breast: Secondary | ICD-10-CM | POA: Insufficient documentation

## 2023-07-21 DIAGNOSIS — Z30011 Encounter for initial prescription of contraceptive pills: Secondary | ICD-10-CM

## 2023-07-21 DIAGNOSIS — Z3046 Encounter for surveillance of implantable subdermal contraceptive: Secondary | ICD-10-CM

## 2023-07-21 DIAGNOSIS — N6322 Unspecified lump in the left breast, upper inner quadrant: Secondary | ICD-10-CM

## 2023-07-21 MED ORDER — NORETHINDRONE 0.35 MG PO TABS: 1.0000 | ORAL_TABLET | Freq: Every day | ORAL | 0 refills | Status: AC

## 2023-07-21 NOTE — Patient Instructions (Signed)
 I value your feedback and you entrusting [place] with your care. If you get a [facility] patient survey, I would appreciate you taking the time to let us know about your experience today. Thank you! ? ? ?

## 2023-07-22 ENCOUNTER — Encounter: Payer: Self-pay | Admitting: Obstetrics and Gynecology

## 2023-07-22 ENCOUNTER — Ambulatory Visit
Admission: RE | Admit: 2023-07-22 | Discharge: 2023-07-22 | Discharge status: Home / Self Care | Source: Ambulatory Visit | Attending: Obstetrics and Gynecology

## 2023-07-22 ENCOUNTER — Ambulatory Visit
Admission: RE | Admit: 2023-07-22 | Discharge: 2023-07-22 | Disposition: A | Discharge status: Home / Self Care | Source: Ambulatory Visit | Attending: Obstetrics and Gynecology | Admitting: Obstetrics and Gynecology

## 2023-07-22 DIAGNOSIS — N6322 Unspecified lump in the left breast, upper inner quadrant: Secondary | ICD-10-CM

## 2023-07-22 DIAGNOSIS — Z803 Family history of malignant neoplasm of breast: Secondary | ICD-10-CM

## 2023-07-24 LAB — CYTOLOGY - PAP
Comment: NEGATIVE
Diagnosis: NEGATIVE
High risk HPV: NEGATIVE

## 2023-10-13 ENCOUNTER — Telehealth: Payer: Self-pay

## 2023-10-13 NOTE — Telephone Encounter (Signed)
 Received fax from [facility], test cancelled due to lack of insurance. Called [facility] and could not get information of why cancelled. Duwaine said she has sent msg to authorization team for reason of denial.

## 2023-10-20 ENCOUNTER — Telehealth: Payer: Self-pay

## 2023-10-20 NOTE — Telephone Encounter (Signed)
 Duwaine called from [facility] returning call to [place] to advise her that the above patient laboratory test was denied by her insurance. [facility] stated that insurance determined given patients family history she was not appropriate candidate for testing and is recommended that another member be tested for this test.

## 2023-10-23 DIAGNOSIS — Z803 Family history of malignant neoplasm of breast: Secondary | ICD-10-CM

## 2023-10-23 DIAGNOSIS — Z1371 Encounter for nonprocreative screening for genetic disease carrier status: Secondary | ICD-10-CM

## 2023-10-23 HISTORY — DX: Encounter for nonprocreative screening for genetic disease carrier status: Z13.71

## 2023-10-23 HISTORY — DX: Family history of malignant neoplasm of breast: Z80.3

## 2023-11-05 NOTE — Telephone Encounter (Signed)
 Called [facility] spoke to [place] and was advised pt does meet criteria? Will look into this and call me back.

## 2023-11-09 NOTE — Telephone Encounter (Signed)
 Received fax that testing has started for patient.

## 2023-11-12 ENCOUNTER — Encounter: Payer: Self-pay | Admitting: Obstetrics and Gynecology

## 2023-11-18 ENCOUNTER — Encounter: Payer: Self-pay | Admitting: Obstetrics and Gynecology

## 2023-11-18 NOTE — Telephone Encounter (Signed)
 Pt aware of neg MyRisk results. IBIS=15.1%/riskscore=16.4%. No increased screening recommendations.   Patient understands these results only apply to her and her children, and this is not indicative of genetic testing results of her other family members. It is recommended that her other family members have genetic testing done.  Pt also understands negative genetic testing doesn't mean she will never get any of these cancers.   Pt has copy through [facility] portal. F/u prn.
